# Patient Record
Sex: Male | Born: 1945
Health system: Southern US, Community
[De-identification: ages and names within clinical notes are randomized; demographics above are authoritative.]

## PROBLEM LIST (undated history)

## (undated) DIAGNOSIS — R972 Elevated prostate specific antigen [PSA]: Secondary | ICD-10-CM

## (undated) DIAGNOSIS — J189 Pneumonia, unspecified organism: Secondary | ICD-10-CM

## (undated) DIAGNOSIS — I350 Nonrheumatic aortic (valve) stenosis: Secondary | ICD-10-CM

## (undated) DIAGNOSIS — G4730 Sleep apnea, unspecified: Secondary | ICD-10-CM

## (undated) DIAGNOSIS — I4891 Unspecified atrial fibrillation: Secondary | ICD-10-CM

## (undated) DIAGNOSIS — I2510 Atherosclerotic heart disease of native coronary artery without angina pectoris: Secondary | ICD-10-CM

## (undated) DIAGNOSIS — R011 Cardiac murmur, unspecified: Secondary | ICD-10-CM

## (undated) DIAGNOSIS — I219 Acute myocardial infarction, unspecified: Secondary | ICD-10-CM

## (undated) DIAGNOSIS — Z952 Presence of prosthetic heart valve: Secondary | ICD-10-CM

## (undated) DIAGNOSIS — I10 Essential (primary) hypertension: Secondary | ICD-10-CM

## (undated) DIAGNOSIS — E785 Hyperlipidemia, unspecified: Secondary | ICD-10-CM

## (undated) DIAGNOSIS — K219 Gastro-esophageal reflux disease without esophagitis: Secondary | ICD-10-CM

## (undated) DIAGNOSIS — I499 Cardiac arrhythmia, unspecified: Secondary | ICD-10-CM

## (undated) HISTORY — DX: Nonrheumatic aortic (valve) stenosis: I35.0

## (undated) HISTORY — PX: COLON SURGERY: SHX602

## (undated) HISTORY — DX: Acute myocardial infarction, unspecified: I21.9

## (undated) HISTORY — PX: AORTIC VALVE SURGERY: SHX549

## (undated) HISTORY — PX: PROSTATE BIOPSY: SHX241

## (undated) HISTORY — DX: Essential (primary) hypertension: I10

## (undated) HISTORY — PX: OTHER SURGICAL HISTORY: SHX169

## (undated) HISTORY — DX: Sleep apnea, unspecified: G47.30

## (undated) HISTORY — DX: Unspecified atrial fibrillation: I48.91

## (undated) HISTORY — PX: SPHINCTEROTOMY: SHX5279

## (undated) HISTORY — PX: CHOLECYSTECTOMY: SHX55

## (undated) HISTORY — PX: ROTATOR CUFF REPAIR: SHX139

## (undated) HISTORY — PX: COLONOSCOPY W/ BIOPSIES AND POLYPECTOMY: SHX1376

## (undated) HISTORY — DX: Gastro-esophageal reflux disease without esophagitis: K21.9

## (undated) HISTORY — PX: CARDIAC VALVE REPLACEMENT: SHX585

## (undated) HISTORY — DX: Hyperlipidemia, unspecified: E78.5

## (undated) HISTORY — DX: Cardiac murmur, unspecified: R01.1

---

## 2005-02-22 ENCOUNTER — Ambulatory Visit: Payer: Self-pay | Admitting: Internal Medicine

## 2005-03-01 ENCOUNTER — Ambulatory Visit: Payer: Self-pay | Admitting: Internal Medicine

## 2005-03-21 ENCOUNTER — Ambulatory Visit: Payer: Self-pay | Admitting: Surgery

## 2009-06-28 ENCOUNTER — Ambulatory Visit: Payer: Self-pay | Admitting: Internal Medicine

## 2011-06-15 ENCOUNTER — Inpatient Hospital Stay: Payer: Self-pay | Admitting: Internal Medicine

## 2011-07-02 ENCOUNTER — Ambulatory Visit: Payer: Self-pay | Admitting: Internal Medicine

## 2012-12-08 DIAGNOSIS — K219 Gastro-esophageal reflux disease without esophagitis: Secondary | ICD-10-CM | POA: Insufficient documentation

## 2012-12-08 DIAGNOSIS — I350 Nonrheumatic aortic (valve) stenosis: Secondary | ICD-10-CM | POA: Insufficient documentation

## 2012-12-08 DIAGNOSIS — I4891 Unspecified atrial fibrillation: Secondary | ICD-10-CM | POA: Insufficient documentation

## 2012-12-08 DIAGNOSIS — I10 Essential (primary) hypertension: Secondary | ICD-10-CM | POA: Insufficient documentation

## 2012-12-09 ENCOUNTER — Encounter: Payer: Self-pay | Admitting: Surgery

## 2012-12-09 ENCOUNTER — Institutional Professional Consult (permissible substitution) (INDEPENDENT_AMBULATORY_CARE_PROVIDER_SITE_OTHER): Payer: BC Managed Care – PPO | Admitting: Surgery

## 2012-12-09 VITALS — BP 141/94 | HR 90 | Resp 16 | Ht 74.0 in | Wt 265.0 lb

## 2012-12-09 DIAGNOSIS — I359 Nonrheumatic aortic valve disorder, unspecified: Secondary | ICD-10-CM

## 2012-12-09 DIAGNOSIS — I350 Nonrheumatic aortic (valve) stenosis: Secondary | ICD-10-CM

## 2012-12-11 ENCOUNTER — Encounter: Payer: Self-pay | Admitting: Surgery

## 2012-12-11 NOTE — Progress Notes (Signed)
[address]            [place] [address]          [phone]      PCP is Fidela Juneau, MD Referring Provider : pt self-referred  Chief Complaint  Patient presents with  . Aortic Stenosis    wanting 2nd opinion regarding diagnosis    HPI:  The patient is a 67 year old with a history of aortic stenosis who has been followed by Dr. Gwen Pounds in [place]. His last echocardiogram on 05/07/2012 showed a peak gradient of 66 mm mercury and a mean gradient of 36 mm mercury with an aortic valve area of 0.85-1.02 cm square. There was normal left ventricular systolic function with an ejection fraction of 60%. Left ventricular size was normal. Review of his previous echocardiogram reports dating back to 07/12/2009 show a progressive increase in the gradient. The patient remains active and said that he has done well overall but notices that things are different now compared to one year ago. He now has some exertional dyspnea and fatigue that was not present one year ago. He does report some exertional chest tightness. These symptoms have not present prevented him from being active but he has deliberately decreased his exertion level to prevent these symptoms.  Past Medical History  Diagnosis Date  . Aortic stenosis   . Hypertension   . Hyperlipidemia   . GERD (gastroesophageal reflux disease)   . A-fib: He had one episode in the past converted with Cardizem.     now in normal sinus rhytm  . Sleep apnea: uses CPAP regularly and has noticed much improvement in how he feels.     Past Surgical History  Procedure Date  . Sphincterotomy   . Partial removal of colon   . Right shoulder surgery     separation/ dislocated  . Rotator cuff repair   . Prostate biopsy   . Colonoscopy w/ biopsies and polypectomy     3 polyps removed  . Achilles tendon rupture repair     History reviewed. No pertinent family history.  Social History History  Substance Use Topics    . Smoking status: Light Tobacco Smoker    Types: Cigars  . Smokeless tobacco: Never Used  . Alcohol Use: Yes     Comment: occasional     Current Outpatient Prescriptions  Medication Sig Dispense Refill  . aspirin 81 MG tablet Take 81 mg by mouth daily.      Marland Kitchen esomeprazole (NEXIUM) 40 MG capsule Take 40 mg by mouth daily before breakfast.      . lisinopril (PRINIVIL,ZESTRIL) 10 MG tablet Take 10 mg by mouth daily. Takes 15 mgm      . metoprolol succinate (TOPROL-XL) 25 MG 24 hr tablet Take 25 mg by mouth daily.      . rosuvastatin (CRESTOR) 5 MG tablet Take 5 mg by mouth daily.      . Tamsulosin HCl (FLOMAX) 0.4 MG CAPS Take 0.4 mg by mouth daily.        Allergies  Allergen Reactions  . Erythromycin     Review of Systems  Constitutional: Positive for activity change and fatigue. Negative for fever, chills, diaphoresis, appetite change and unexpected weight change.  HENT: Negative.   Eyes: Negative.   Respiratory: Positive for chest tightness and shortness of breath.        With heavy exertion  Cardiovascular: Negative for chest pain, palpitations and leg swelling.  Gastrointestinal: Negative.   Genitourinary: Negative.   Musculoskeletal: Negative.   Neurological: Negative.  Negative for dizziness, syncope and light-headedness.  Hematological: Negative.   Psychiatric/Behavioral: Negative.     BP 141/94  Pulse 90  Resp 16  Ht 6\' 2"  (1.88 m)  Wt 265 lb (120.203 kg)  BMI 34.02 kg/m2  SpO2 98% Physical Exam  Constitutional: He is oriented to person, place, and time. He appears well-developed and well-nourished. No distress.  HENT:  Head: Normocephalic and atraumatic.  Mouth/Throat: Oropharynx is clear and moist.  Eyes: EOM are normal. Pupils are equal, round, and reactive to light.  Neck: Normal range of motion. Neck supple. No JVD present. No thyromegaly present.  Cardiovascular: Normal rate and regular rhythm.        3/6 systolic murmur over aorta radiating into both  sides of the neck.  Pulmonary/Chest: Effort normal and breath sounds normal. No respiratory distress. He has no wheezes. He has no rales.  Abdominal: Soft. Bowel sounds are normal. He exhibits no distension and no mass. There is no tenderness.  Musculoskeletal: He exhibits no edema.  Lymphadenopathy:    He has no cervical adenopathy.  Neurological: He is alert and oriented to person, place, and time. He has normal strength. No cranial nerve deficit or sensory deficit.  Skin: Skin is warm and dry.  Psychiatric: He has a normal mood and affect.     Impression/Plan:  He has known aortic stenosis with gradient and valve area measurements by echocardiogram in June 2013 putting him just into the severe range. He is doing well overall but does have symptoms attributable to his aortic stenosis that he knows were not present one year ago. He does have moderate left ventricular hypertrophy. I recommended proceeding with aortic valve replacement to prevent further progression of symptoms and left ventricular hypertrophy. I reviewed all the echocardiogram findings with the patient and his wife and my reasons for recommending surgery. I answered all their questions. I told them that I didn't think he needed to have urgent surgery but should start to think about it so that he can plan to have it done what is most convenient in his busy schedule. He said that he is scheduled to see Dr. Gwen Pounds again soon for a followup stress test and will discuss it further with him. I think he should have a repeat 2-D echocardiogram to reevaluate the aortic stenosis and left ventricle. He would require cardiac catheterization prior to proceeding with aortic valve replacement. I told him I would be happy to see him back in the future to answer any further questions or discuss surgery further with them.

## 2013-01-05 ENCOUNTER — Institutional Professional Consult (permissible substitution) (INDEPENDENT_AMBULATORY_CARE_PROVIDER_SITE_OTHER): Payer: BC Managed Care – PPO | Admitting: Surgery

## 2013-01-05 ENCOUNTER — Encounter: Payer: Self-pay | Admitting: Surgery

## 2013-01-05 VITALS — BP 131/83 | HR 78 | Resp 20 | Ht 74.0 in | Wt 273.0 lb

## 2013-01-05 DIAGNOSIS — I350 Nonrheumatic aortic (valve) stenosis: Secondary | ICD-10-CM

## 2013-01-05 DIAGNOSIS — I359 Nonrheumatic aortic valve disorder, unspecified: Secondary | ICD-10-CM

## 2013-01-06 ENCOUNTER — Encounter: Payer: BC Managed Care – PPO | Admitting: Surgery

## 2013-01-07 ENCOUNTER — Encounter: Payer: Self-pay | Admitting: Surgery

## 2013-01-07 NOTE — Progress Notes (Signed)
                    [address]            [place] [address]          [phone]      HPI:  The patient returned to the office today to further discuss aortic valve replacement. He underwent a stress echocardiogram on 12/17/2012 which showed an abnormal treadmill ECG with ST changes consistent with left ventricular hypertrophy. There was average exercise tolerance for his age although it was less than on his previous test. There were normal stress echocardiographic images without evidence of ischemia. Since I last saw him a few weeks ago he has had no changes. He continues to have dyspnea with significant exertion such as going up 2 flights of stairs. He continues to notice that he does not have the same exertional tolerance he did a year ago.  Current Outpatient Prescriptions  Medication Sig Dispense Refill  . aspirin 81 MG tablet Take 81 mg by mouth daily.      Marland Kitchen esomeprazole (NEXIUM) 40 MG capsule Take 40 mg by mouth daily before breakfast.      . lisinopril (PRINIVIL,ZESTRIL) 10 MG tablet Take 10 mg by mouth daily. Takes 15 mgm      . metoprolol succinate (TOPROL-XL) 25 MG 24 hr tablet Take 25 mg by mouth daily.      . rosuvastatin (CRESTOR) 5 MG tablet Take 5 mg by mouth daily.      . Tamsulosin HCl (FLOMAX) 0.4 MG CAPS Take 0.4 mg by mouth daily.         Physical Exam: BP 131/83  Pulse 78  Resp 20  Ht 6\' 2"  (1.88 m)  Wt 273 lb (123.832 kg)  BMI 35.05 kg/m2  SpO2 98% Cardiac exam shows a 3/6 systolic murmur over the aorta radiating into both sides of the neck. Lung exam is clear. There is no peripheral edema.    Impression/Plan:  He has severe aortic stenosis with normal left ventricular function and moderate concentric left ventricular hypertrophy that is becoming symptomatic. I recommended that he make plans to undergo aortic valve replacement. He would like to wait until May or November and I told him that I thought it would be satisfactory to wait  until May but I think he would be taking some risk waiting until next November and there is a significant chance that he will have progression of his symptoms over that time. I discussed the operative procedure in detail with him. We discussed the pros and cons of mechanical and tissue valves. I think a tissue valve would probably be the best option for him given his age and lifestyle with frequent traveling. I think the risk of structural valve deterioration requiring further intervention over his lifetime would be low. Mechanical valves do have a 1-2% per year risk of both thromboembolism and anticoagulant related bleeding. He is in agreement with a tissue valve. He is planning on scheduling surgery for May and will contact our office in the near future to schedule that. He will require cardiac catheterization by Dr. Gwen Pounds preoperatively and I think that could be done anytime over the next couple months as long as he has about a week after the catheterization before proceeding with surgery to minimize any insult to the kidneys.

## 2013-01-20 ENCOUNTER — Other Ambulatory Visit: Payer: Self-pay | Admitting: *Deleted

## 2013-01-20 DIAGNOSIS — I359 Nonrheumatic aortic valve disorder, unspecified: Secondary | ICD-10-CM

## 2013-03-03 DIAGNOSIS — Z952 Presence of prosthetic heart valve: Secondary | ICD-10-CM

## 2013-03-03 HISTORY — DX: Presence of prosthetic heart valve: Z95.2

## 2013-03-13 HISTORY — PX: CARDIAC VALVE REPLACEMENT: SHX585

## 2013-03-25 ENCOUNTER — Ambulatory Visit: Payer: BC Managed Care – PPO | Admitting: Surgery

## 2013-03-25 ENCOUNTER — Other Ambulatory Visit (HOSPITAL_COMMUNITY): Payer: BC Managed Care – PPO

## 2013-03-27 ENCOUNTER — Inpatient Hospital Stay (HOSPITAL_COMMUNITY): Admission: RE | Admit: 2013-03-27 | Payer: BC Managed Care – PPO | Source: Ambulatory Visit | Admitting: Surgery

## 2013-03-27 ENCOUNTER — Encounter (HOSPITAL_COMMUNITY): Admission: RE | Payer: Self-pay | Source: Ambulatory Visit

## 2013-03-27 SURGERY — REPLACEMENT, AORTIC VALVE, OPEN
Anesthesia: General | Site: Chest

## 2013-04-09 ENCOUNTER — Ambulatory Visit (HOSPITAL_COMMUNITY): Payer: BC Managed Care – PPO

## 2013-04-09 ENCOUNTER — Encounter (HOSPITAL_COMMUNITY): Payer: BC Managed Care – PPO

## 2013-04-09 ENCOUNTER — Other Ambulatory Visit (HOSPITAL_COMMUNITY): Payer: BC Managed Care – PPO

## 2013-04-24 ENCOUNTER — Encounter (HOSPITAL_COMMUNITY): Payer: BC Managed Care – PPO

## 2014-02-05 HISTORY — PX: CORONARY ANGIOPLASTY WITH STENT PLACEMENT: SHX49

## 2014-03-30 DIAGNOSIS — I2510 Atherosclerotic heart disease of native coronary artery without angina pectoris: Secondary | ICD-10-CM | POA: Insufficient documentation

## 2014-11-10 DIAGNOSIS — G4733 Obstructive sleep apnea (adult) (pediatric): Secondary | ICD-10-CM | POA: Insufficient documentation

## 2015-12-07 ENCOUNTER — Other Ambulatory Visit: Payer: Self-pay | Admitting: Internal Medicine

## 2015-12-07 DIAGNOSIS — R1084 Generalized abdominal pain: Secondary | ICD-10-CM

## 2015-12-12 ENCOUNTER — Ambulatory Visit
Admission: RE | Admit: 2015-12-12 | Discharge: 2015-12-12 | Disposition: A | Discharge status: Home / Self Care | Payer: BLUE CROSS/BLUE SHIELD | Source: Ambulatory Visit | Attending: Internal Medicine | Admitting: Internal Medicine

## 2015-12-12 DIAGNOSIS — K4020 Bilateral inguinal hernia, without obstruction or gangrene, not specified as recurrent: Secondary | ICD-10-CM | POA: Insufficient documentation

## 2015-12-12 DIAGNOSIS — K5730 Diverticulosis of large intestine without perforation or abscess without bleeding: Secondary | ICD-10-CM | POA: Diagnosis not present

## 2015-12-12 DIAGNOSIS — R1084 Generalized abdominal pain: Secondary | ICD-10-CM | POA: Diagnosis present

## 2015-12-12 HISTORY — DX: Presence of prosthetic heart valve: Z95.2

## 2015-12-12 MED ORDER — IOHEXOL 350 MG/ML SOLN
100.0000 mL | Freq: Once | INTRAVENOUS | Status: AC | PRN
  Administered 2015-12-12: 100 mL via INTRAVENOUS

## 2016-04-08 ENCOUNTER — Telehealth: Payer: Self-pay | Admitting: Urology

## 2016-04-08 DIAGNOSIS — N5089 Other specified disorders of the male genital organs: Secondary | ICD-10-CM

## 2016-04-08 NOTE — Telephone Encounter (Signed)
Referred for possible testicular tumor, mild discomfort and palpable intratesticular mass.    Recommend stat scrotal ultrasound and f/u in my office Tuesday at 3:30 pm to reviewed results.    [name], MD

## 2016-04-09 ENCOUNTER — Ambulatory Visit
Admission: RE | Admit: 2016-04-09 | Discharge: 2016-04-09 | Disposition: A | Discharge status: Home / Self Care | Payer: BLUE CROSS/BLUE SHIELD | Source: Ambulatory Visit | Attending: Urology | Admitting: Urology

## 2016-04-09 DIAGNOSIS — N509 Disorder of male genital organs, unspecified: Secondary | ICD-10-CM | POA: Insufficient documentation

## 2016-04-09 DIAGNOSIS — N433 Hydrocele, unspecified: Secondary | ICD-10-CM | POA: Diagnosis not present

## 2016-04-09 DIAGNOSIS — N5089 Other specified disorders of the male genital organs: Secondary | ICD-10-CM

## 2016-04-09 NOTE — Telephone Encounter (Signed)
done

## 2016-04-09 NOTE — Telephone Encounter (Signed)
You are not in the office on Tuesday, where do you want me to put him?   Sharyn Lull

## 2016-04-10 ENCOUNTER — Encounter: Payer: Self-pay | Admitting: Urology

## 2016-04-10 ENCOUNTER — Ambulatory Visit (INDEPENDENT_AMBULATORY_CARE_PROVIDER_SITE_OTHER): Payer: BLUE CROSS/BLUE SHIELD | Admitting: Urology

## 2016-04-10 VITALS — BP 155/83 | HR 79 | Ht 74.0 in | Wt 277.0 lb

## 2016-04-10 DIAGNOSIS — R972 Elevated prostate specific antigen [PSA]: Secondary | ICD-10-CM | POA: Insufficient documentation

## 2016-04-10 DIAGNOSIS — E785 Hyperlipidemia, unspecified: Secondary | ICD-10-CM | POA: Insufficient documentation

## 2016-04-10 DIAGNOSIS — Z87898 Personal history of other specified conditions: Secondary | ICD-10-CM

## 2016-04-10 DIAGNOSIS — N509 Disorder of male genital organs, unspecified: Secondary | ICD-10-CM

## 2016-04-10 DIAGNOSIS — I4891 Unspecified atrial fibrillation: Secondary | ICD-10-CM | POA: Insufficient documentation

## 2016-04-10 DIAGNOSIS — N5089 Other specified disorders of the male genital organs: Secondary | ICD-10-CM

## 2016-04-10 DIAGNOSIS — N400 Enlarged prostate without lower urinary tract symptoms: Secondary | ICD-10-CM | POA: Diagnosis not present

## 2016-04-10 MED ORDER — SULFAMETHOXAZOLE-TRIMETHOPRIM 800-160 MG PO TABS: 1.0000 | ORAL_TABLET | Freq: Two times a day (BID) | ORAL | Status: DC

## 2016-04-10 NOTE — Progress Notes (Signed)
04/10/2016 4:16 PM   [place] 1946/09/07 IN:[id number]  Referring provider: Corey Skains, MD [facility] [place], [place] [address]  Chief Complaint  Patient presents with  . Testicle Pain    New Patient    HPI:  70 year old male who presents today for further evaluation of right scrotal mass. He first noticed this area of mild tenderness within the right scrotum approximately 3 weeks ago.  At first, it started with a dull ache which led him to do self-examination at which time a right-sided scrotal mass was identified. This is remained relatively stable in size. No redness or drainage. No history of scrotal abscesses. The area is not particularly painful but is uncomfortable at times especially with manipulation or mild daily trauma.    He denies any urinary symptoms including dysuria, hematuria, urinary urgency or frequency.  He is having history of BPH on Flomax. He was seen by urologist proximal May 5 years ago, Dr. cope at [facility] for an episode of elevated PSA which rose from 0.62  to 3.8 leading to a negative prostate biopsy.  His most recent PSA was 1.4 on 04/03/2016.  He does also have a remote history of colovesical fistula status post colectomy with bladder closure in 1991.  He is status post vasectomy in 1984.  No weight loss, fevers, or chills.  He will be leaving late next week for a 2 week fishing trip to [place].  PMH: Past Medical History  Diagnosis Date  . Aortic stenosis   . Hypertension   . Hyperlipidemia   . GERD (gastroesophageal reflux disease)   . A-fib ([facility])     now in normal sinus rhytm  . Sleep apnea   . Aortic valve replaced 03/2013  . Heart attack ([place])   . Heart murmur     Surgical History: Past Surgical History  Procedure Laterality Date  . Sphincterotomy    . Partial removal of colon    . Right shoulder surgery      separation/ dislocated  . Rotator cuff repair    . Prostate biopsy      . Colonoscopy w/ biopsies and polypectomy      3 polyps removed  . Achilles tendon rupture repair      Home Medications:    Medication List       This list is accurate as of: 04/10/16  4:16 PM.  Always use your most recent med list.               aspirin 81 MG tablet  Take 81 mg by mouth daily.     atorvastatin 80 MG tablet  Commonly known as:  LIPITOR  Take by mouth.     esomeprazole 40 MG capsule  Commonly known as:  NEXIUM  Take 40 mg by mouth daily before breakfast.     lisinopril 10 MG tablet  Commonly known as:  PRINIVIL,ZESTRIL  Take 10 mg by mouth daily. Takes 15 mgm     tamsulosin 0.4 MG Caps capsule  Commonly known as:  FLOMAX  Take 0.4 mg by mouth daily.        Allergies: No Known Allergies  Family History: Family History  Problem Relation Age of Onset  . Bladder Cancer Neg Hx   . Prostate cancer Neg Hx   . Kidney cancer Neg Hx     Social History:  reports that he has quit smoking. His smoking use included Cigars. He has never  used smokeless tobacco. He reports that he drinks alcohol. He reports that he does not use illicit drugs.  ROS: UROLOGY Frequent Urination?: No Hard to postpone urination?: No Burning/pain with urination?: No Get up at night to urinate?: Yes Leakage of urine?: Yes Urine stream starts and stops?: No Trouble starting stream?: No Do you have to strain to urinate?: No Blood in urine?: No Urinary tract infection?: No Sexually transmitted disease?: No Injury to kidneys or bladder?: No Painful intercourse?: No Weak stream?: No Erection problems?: No Penile pain?: No  Gastrointestinal Nausea?: No Vomiting?: No Indigestion/heartburn?: No Diarrhea?: No Constipation?: No  Constitutional Fever: No Night sweats?: No Weight loss?: No Fatigue?: No  Skin Skin rash/lesions?: No Itching?: No  Eyes Blurred vision?: No Double vision?: No  Ears/Nose/Throat Sore throat?: No Sinus problems?:  No  Hematologic/Lymphatic Swollen glands?: No Easy bruising?: No  Cardiovascular Leg swelling?: No Chest pain?: No  Respiratory Cough?: No Shortness of breath?: No  Endocrine Excessive thirst?: No  Musculoskeletal Back pain?: No Joint pain?: No  Neurological Headaches?: No Dizziness?: No  Psychologic Depression?: No Anxiety?: No  Physical Exam: BP 155/83 mmHg  Pulse 79  Ht 6\' 2"  (1.88 m)  Wt 277 lb (125.646 kg)  BMI 35.55 kg/m2  Constitutional:  Alert and oriented, No acute distress. HEENT: [place] AT, moist mucus membranes.  Trachea midline, no masses. Cardiovascular: No clubbing, cyanosis, or edema. Respiratory: Normal respiratory effort, no increased work of breathing. GI: Abdomen is soft, nontender, nondistended, no abdominal masses GU: No CVA tenderness. Circumcised phallus with orthotopic meatus. Scrotum unremarkable without any erythema, edema, or induration. Left testicle normal without masses. Right testicle well-circumscribed, normal without any intratesticular masses. Firm, complex, irregular contour mildly tender mass located in what appears to be the tail of the right epididymis, ~2.5 cm without overlying skin changes.  This mass appears to be discretely separate from the right testicle. Skin: No rashes, bruises or suspicious lesions. Lymph: No cervical or inguinal adenopathy. Neurologic: Grossly intact, no focal deficits, moving all 4 extremities. Psychiatric: Normal mood and affect.  Laboratory Data: Labs from 04/03/2016 reviewed Creatinine 1.3 WBC 4.9 Hemoglobin 15.1 Platelets 129 Hbg A1c 6.1   Urinalysis UA from 04/03/2016 negative without evidence of blood, WBC, or nitrites. Urine culture growing less than 10,000 colonies, not speciated.  Pertinent Imaging:  Study Result     CLINICAL DATA: 70 year old with right scrotal mass for 1 month. Slight soreness. No known injury.  EXAM: SCROTAL ULTRASOUND  DOPPLER ULTRASOUND OF THE  TESTICLES  TECHNIQUE: Complete ultrasound examination of the testicles, epididymis, and other scrotal structures was performed. Color and spectral Doppler ultrasound were also utilized to evaluate blood flow to the testicles.  COMPARISON: Pelvic CT 12/12/2015.  FINDINGS: Right testicle  Measurements: 4.5 x 2.5 x 3.5 cm. No testicular mass demonstrated. There is normal blood flow with color Doppler.  Left testicle  Measurements: 4.7 x 2.6 x 3.7 cm. No mass or microlithiasis visualized.  Right epididymis: Normal in size and appearance. There is an extra testicular mass which appears separate from the epididymis, measuring 1.7 x 2.3 x 2.6 cm. This is complex with solid and cystic areas as well as internal blood flow. No peristalsis identified.  Left epididymis: 2.5 mm epididymal cyst. Otherwise unremarkable.  Hydrocele: Small bilateral hydroceles.  Varicocele: None visualized.  Pulsed Doppler interrogation of both testes demonstrates normal low resistance arterial and venous waveforms bilaterally.  IMPRESSION: 1. Both testes appear normal. No evidence of intratesticular mass or torsion. 2. There is  an extratesticular mass on the right which appears separate from the epididymal head. Differential considerations include focal inflammation or atypical neoplasm involving the tunica vaginalis or epididymal tail. A hernia is consider less likely. Clinical follow up recommended with consideration of follow-up ultrasound or MRI for further characterization. 3. Small bilateral hydroceles.   Electronically Signed  By: Richardean Sale M.D.  On: 04/09/2016 12:23        Assessment & Plan:    1. Epididymal mass Examination today consistent with mildly tender complex lesion involving the tail of the right epididymis. Inflammatory/infectious process highly favored over malignancy. We discussed today that extratesticular testicular tumors are very uncommon  and given that this area arose relatively quickly and is mildly tender, the former is significantly more likely.  We discussed various options today including continued observation with addition of NSAIDs, supportive care, and antibiotics 2 weeks with reevaluation.  Alternatively, excisional biopsy was also discussed. We discussed potential risks/ benefits.  He is comfortable with observation/NSAIDs/supportive care/antibiotics. If failure to improve at that point, maybe consider proceeding with excision versus repeat ultrasound.  - sulfamethoxazole-trimethoprim (BACTRIM DS,SEPTRA DS) 800-160 MG tablet; Take 1 tablet by mouth 2 (two) times daily.  Dispense: 28 tablet; Refill: 0  2. History of elevated PSA Stable, non elevated S/p negative biopsy  3. BPH  Continue flomax   Follow up in 2-3 weeks for reevaluation  [name], MD  [facility] [address] [place], [place] [address] [phone]

## 2016-04-18 ENCOUNTER — Other Ambulatory Visit: Payer: Self-pay

## 2016-04-19 DIAGNOSIS — Z9889 Other specified postprocedural states: Secondary | ICD-10-CM | POA: Insufficient documentation

## 2016-05-07 ENCOUNTER — Ambulatory Visit (INDEPENDENT_AMBULATORY_CARE_PROVIDER_SITE_OTHER): Payer: BLUE CROSS/BLUE SHIELD | Admitting: Urology

## 2016-05-07 ENCOUNTER — Encounter: Payer: Self-pay | Admitting: Urology

## 2016-05-07 VITALS — BP 148/91 | HR 80 | Ht 74.0 in | Wt 270.0 lb

## 2016-05-07 DIAGNOSIS — Z87898 Personal history of other specified conditions: Secondary | ICD-10-CM | POA: Diagnosis not present

## 2016-05-07 DIAGNOSIS — N509 Disorder of male genital organs, unspecified: Secondary | ICD-10-CM | POA: Diagnosis not present

## 2016-05-07 DIAGNOSIS — N400 Enlarged prostate without lower urinary tract symptoms: Secondary | ICD-10-CM

## 2016-05-07 DIAGNOSIS — N5089 Other specified disorders of the male genital organs: Secondary | ICD-10-CM

## 2016-05-07 NOTE — Progress Notes (Signed)
8:08 PM  05/07/2016   Adam Zavala 08/24/1946 [id number]  Referring provider: Corey Skains, MD [place] [place], [place] [address]  Chief Complaint  Patient presents with  . Epididymal Mass    20month    HPI:  70 year old Male who presents for follow-up for right extratesticular scrotal mass.   Right extratesticular scrotal mass Initially seen and evaluated approximately one month ago for right extratesticular scrotal mass. At that time, he is having right testicular discomfort.  Scrotal ultrasound showed a 1.7 x 2.2 x 2.6 solid and cystic lesion adjacent to the epididymis of unclear etiology.  He was treated with extended course of Bactrim while away on vacation in [place]. He reports that the tenderness has completely resolved. He thinks that the overall size of the lesion is likely stable. No overlying skin changes, erythema, or drainage. He has no complaints today.  History of BPH/ history of elevated PSA He is having history of BPH on Flomax. He was seen by urologist proximal May 5 years ago, Dr. cope at [facility] urology for an episode of elevated PSA which rose from 0.62  to 3.8 leading to a negative prostate biopsy.  His most recent PSA was 1.4 on 04/03/2016.  He denies any urinary symptoms including dysuria, hematuria, urinary urgency or frequency.   PMH: Past Medical History  Diagnosis Date  . Aortic stenosis   . Hypertension   . Hyperlipidemia   . GERD (gastroesophageal reflux disease)   . A-fib (HCC)     now in normal sinus rhytm  . Sleep apnea   . Aortic valve replaced 03/2013  . Heart attack ([place])   . Heart murmur     Surgical History: Past Surgical History  Procedure Laterality Date  . Sphincterotomy    . Partial removal of colon    . Right shoulder surgery      separation/ dislocated  . Rotator cuff repair    . Prostate biopsy    . Colonoscopy w/ biopsies and polypectomy      3 polyps removed  . Achilles  tendon rupture repair      Home Medications:    Medication List       This list is accurate as of: 05/07/16  8:08 PM.  Always use your most recent med list.               aspirin 81 MG tablet  Take 81 mg by mouth daily.     atorvastatin 80 MG tablet  Commonly known as:  LIPITOR  Take by mouth.     esomeprazole 40 MG capsule  Commonly known as:  NEXIUM  Take 40 mg by mouth daily before breakfast.     lisinopril 10 MG tablet  Commonly known as:  PRINIVIL,ZESTRIL  Take 10 mg by mouth daily. Takes 15 mgm     tamsulosin 0.4 MG Caps capsule  Commonly known as:  FLOMAX  Take 0.4 mg by mouth daily.        Allergies: No Known Allergies  Family History: Family History  Problem Relation Age of Onset  . Bladder Cancer Neg Hx   . Prostate cancer Neg Hx   . Kidney cancer Neg Hx     Social History:  reports that he has quit smoking. His smoking use included Cigars. He has never used smokeless tobacco. He reports that he drinks alcohol. He reports that he does not use illicit drugs.  ROS: UROLOGY Frequent Urination?:  No Hard to postpone urination?: No Burning/pain with urination?: No Get up at night to urinate?: No Leakage of urine?: No Urine stream starts and stops?: No Trouble starting stream?: No Do you have to strain to urinate?: No Blood in urine?: No Urinary tract infection?: No Sexually transmitted disease?: No Injury to kidneys or bladder?: No Painful intercourse?: No Weak stream?: No Erection problems?: No Penile pain?: No  Gastrointestinal Nausea?: No Vomiting?: No Indigestion/heartburn?: No Diarrhea?: No Constipation?: No  Constitutional Fever: No Night sweats?: No Weight loss?: No Fatigue?: No  Skin Skin rash/lesions?: No Itching?: No  Eyes Blurred vision?: No Double vision?: No  Ears/Nose/Throat Sore throat?: No Sinus problems?: No  Hematologic/Lymphatic Swollen glands?: No Easy bruising?: No  Cardiovascular Leg swelling?:  No Chest pain?: No  Respiratory Cough?: No Shortness of breath?: No  Endocrine Excessive thirst?: No  Musculoskeletal Back pain?: No Joint pain?: No  Neurological Headaches?: No Dizziness?: No  Psychologic Depression?: No Anxiety?: No  Physical Exam: BP 148/91 mmHg  Pulse 80  Ht 6\' 2"  (1.88 m)  Wt 270 lb (122.471 kg)  BMI 34.65 kg/m2  Constitutional:  Alert and oriented, No acute distress. HEENT: [place] AT, moist mucus membranes.  Trachea midline, no masses. Cardiovascular: No clubbing, cyanosis, or edema. Respiratory: Normal respiratory effort, no increased work of breathing. GI: Abdomen is soft, nontender, nondistended, no abdominal masses GU: No CVA tenderness. Circumcised phallus with orthotopic meatus. Scrotum unremarkable without any erythema, edema, or induration. Left testicle normal without masses. Right testicle well-circumscribed, normal without any intratesticular masses. Firm, approximately 1 cm spherical right extratesticular mass which appears to be contiguous with the tail of the right epididymis. The firmness extends posteriorly in a linear fashion approximately 1 cm it has a rugated texture along the inferior body of the epidiymis.  Non tender.  No overlying skin changes.  This mass appears to be discretely separate from the right testicle. Skin: No rashes, bruises or suspicious lesions. Lymph: No cervical or inguinal adenopathy. Neurologic: Grossly intact, no focal deficits, moving all 4 extremities. Psychiatric: Normal mood and affect.  Laboratory Data: Labs from 04/03/2016 reviewed Creatinine 1.3 WBC 4.9 Hemoglobin 15.1 Platelets 129 Hbg A1c 6.1   Urinalysis UA from 04/03/2016 negative without evidence of blood, WBC, or nitrites. Urine culture growing less than 10,000 colonies, not speciated.  Pertinent Imaging:  Study Result     CLINICAL DATA: 70 year old with right scrotal mass for 1 month. Slight soreness. No known injury.  EXAM: SCROTAL  ULTRASOUND  DOPPLER ULTRASOUND OF THE TESTICLES  TECHNIQUE: Complete ultrasound examination of the testicles, epididymis, and other scrotal structures was performed. Color and spectral Doppler ultrasound were also utilized to evaluate blood flow to the testicles.  COMPARISON: Pelvic CT 12/12/2015.  FINDINGS: Right testicle  Measurements: 4.5 x 2.5 x 3.5 cm. No testicular mass demonstrated. There is normal blood flow with color Doppler.  Left testicle  Measurements: 4.7 x 2.6 x 3.7 cm. No mass or microlithiasis visualized.  Right epididymis: Normal in size and appearance. There is an extra testicular mass which appears separate from the epididymis, measuring 1.7 x 2.3 x 2.6 cm. This is complex with solid and cystic areas as well as internal blood flow. No peristalsis identified.  Left epididymis: 2.5 mm epididymal cyst. Otherwise unremarkable.  Hydrocele: Small bilateral hydroceles.  Varicocele: None visualized.  Pulsed Doppler interrogation of both testes demonstrates normal low resistance arterial and venous waveforms bilaterally.  IMPRESSION: 1. Both testes appear normal. No evidence of intratesticular mass or torsion.  2. There is an extratesticular mass on the right which appears separate from the epididymal head. Differential considerations include focal inflammation or atypical neoplasm involving the tunica vaginalis or epididymal tail. A hernia is consider less likely. Clinical follow up recommended with consideration of follow-up ultrasound or MRI for further characterization. 3. Small bilateral hydroceles.   Electronically Signed  By: Richardean Sale M.D.  On: 04/09/2016 12:23        Assessment & Plan:    1. Epididymal mass On exam today, the mass appears to be slightly smaller, more well defined, and non nontender and likely contiguous with the right tail and body of the epididymis.  Based on examination and resolution of  tenderness, I feel that this is an inflammatory/infectious process which appears to be slowly resolving.  Again, we reviewed options including surgical excision versus continued observation. Although we're unable to completely rule out malignancy, this is very unlikely both given the overall extremely low incidence of extra testicular malignancies as well as its improvement on examination today.  We did review the risks and benefits of both.  Plan to continue to follow this lesion. Plan for return scrotal check in 4 weeks unless he notes any change, progression, or recurrence and tenderness.   2. History of elevated PSA Stable, non elevated S/p negative biopsy  3. BPH  Continue flomax  Return in about 4 weeks (around 06/04/2016) for scrotal check.    [name], MD  [facility] [address] [place], [place] [address] [phone]

## 2016-05-08 ENCOUNTER — Ambulatory Visit: Payer: BLUE CROSS/BLUE SHIELD | Admitting: Urology

## 2016-06-13 ENCOUNTER — Ambulatory Visit (INDEPENDENT_AMBULATORY_CARE_PROVIDER_SITE_OTHER): Payer: BLUE CROSS/BLUE SHIELD | Admitting: Urology

## 2016-06-13 ENCOUNTER — Encounter: Payer: Self-pay | Admitting: Urology

## 2016-06-13 VITALS — BP 151/82 | HR 69 | Ht 73.0 in | Wt 267.0 lb

## 2016-06-13 DIAGNOSIS — N509 Disorder of male genital organs, unspecified: Secondary | ICD-10-CM

## 2016-06-13 DIAGNOSIS — N5089 Other specified disorders of the male genital organs: Secondary | ICD-10-CM

## 2016-06-13 DIAGNOSIS — Z87898 Personal history of other specified conditions: Secondary | ICD-10-CM | POA: Diagnosis not present

## 2016-06-13 DIAGNOSIS — N400 Enlarged prostate without lower urinary tract symptoms: Secondary | ICD-10-CM | POA: Diagnosis not present

## 2016-06-13 NOTE — Progress Notes (Signed)
12:58 PM  06/13/2016   Adam Zavala [birth year] [id number]  Referring provider: Corey Skains, MD [place], [place] [address]  Chief Complaint  Patient presents with  . Epididymal Mass    1 month    HPI:  70 year old Male who presents for follow-up for right extratesticular scrotal mass.   Right extratesticular scrotal mass Initially seen and evaluated approximately one month ago for right extratesticular scrotal mass. At that time, he is having right testicular discomfort.  Scrotal ultrasound showed a 1.7 x 2.2 x 2.6 solid and cystic lesion adjacent to the epididymis of unclear etiology.  He was treated with extended course of Bactrim while away on vacation in [place]. He reports that the tenderness has completely resolved.   Last visit ~4 weeks ago shows slightly decreased size of lesion which appeared to be contiguous with the tail of the right epididymis, perhaps slightly smaller than initial presentation.  No overlying skin changes, erythema, or drainage. He has no complaints today.  No pain. He thinks that the lesion is unchanged from 1 month ago.  Of note, he does do a lot of traveling including to foreign countries. No personal history of tuberculosis.  He does have a history of a biologic aortic valve, and ASA 81 mg. Followed by Dr. Drema Dallas regularly.  History of BPH/ history of elevated PSA He is having history of BPH on Flomax. He was seen by urologist proximal May 5 years ago, Dr. cope at [facility] urology for an episode of elevated PSA which rose from 0.62  to 3.8 leading to a negative prostate biopsy.  His most recent PSA was 1.4 on 04/03/2016.  He denies any urinary symptoms including dysuria, hematuria, urinary urgency or frequency.   PMH: Past Medical History  Diagnosis Date  . Aortic stenosis   . Hypertension   . Hyperlipidemia   . GERD (gastroesophageal reflux disease)   . A-fib (HCC)     now in normal sinus rhytm  . Sleep apnea   .  Aortic valve replaced 03/2013  . Heart attack ([place])   . Heart murmur     Surgical History: Past Surgical History  Procedure Laterality Date  . Sphincterotomy    . Partial removal of colon    . Right shoulder surgery      separation/ dislocated  . Rotator cuff repair    . Prostate biopsy    . Colonoscopy w/ biopsies and polypectomy      3 polyps removed  . Achilles tendon rupture repair      Home Medications:    Medication List       This list is accurate as of: 06/13/16 12:58 PM.  Always use your most recent med list.               aspirin 81 MG tablet  Take 81 mg by mouth daily.     atorvastatin 80 MG tablet  Commonly known as:  LIPITOR  Take by mouth.     esomeprazole 40 MG capsule  Commonly known as:  NEXIUM  Take 40 mg by mouth daily before breakfast.     lisinopril 10 MG tablet  Commonly known as:  PRINIVIL,ZESTRIL  Take 10 mg by mouth daily. Takes 15 mgm     tamsulosin 0.4 MG Caps capsule  Commonly known as:  FLOMAX  Take 0.4 mg by mouth daily.        Allergies: No Known Allergies  Family History: Family History  Problem Relation Age of Onset  . Bladder Cancer Neg Hx   . Prostate cancer Neg Hx   . Kidney cancer Neg Hx     Social History:  reports that he has quit smoking. His smoking use included Cigars. He has never used smokeless tobacco. He reports that he drinks alcohol. He reports that he does not use illicit drugs.  ROS: UROLOGY Frequent Urination?: No Hard to postpone urination?: No Burning/pain with urination?: No Get up at night to urinate?: No Leakage of urine?: No Urine stream starts and stops?: No Trouble starting stream?: No Do you have to strain to urinate?: No Blood in urine?: No Urinary tract infection?: No Sexually transmitted disease?: No Injury to kidneys or bladder?: No Painful intercourse?: No Weak stream?: No Erection problems?: No Penile pain?: No  Gastrointestinal Nausea?: No Vomiting?:  No Indigestion/heartburn?: No Diarrhea?: No Constipation?: No  Constitutional Fever: No Night sweats?: No Weight loss?: No Fatigue?: No  Skin Skin rash/lesions?: No Itching?: No  Eyes Blurred vision?: No Double vision?: No  Ears/Nose/Throat Sore throat?: No Sinus problems?: No  Hematologic/Lymphatic Swollen glands?: No Easy bruising?: No  Cardiovascular Leg swelling?: No Chest pain?: No  Respiratory Cough?: No Shortness of breath?: No  Endocrine Excessive thirst?: No  Musculoskeletal Back pain?: No Joint pain?: No  Neurological Headaches?: No Dizziness?: No  Psychologic Depression?: No Anxiety?: No  Physical Exam: BP 151/82 mmHg  Pulse 69  Ht 6\' 1"  (1.854 m)  Wt 267 lb (121.11 kg)  BMI 35.23 kg/m2  Constitutional:  Alert and oriented, No acute distress. HEENT: [place] AT, moist mucus membranes.  Trachea midline, no masses. Cardiovascular: No clubbing, cyanosis, or edema. RRR Respiratory: Normal respiratory effort, no increased work of breathing. CTAB. GI: Abdomen is soft, nontender, nondistended, no abdominal masses GU: No CVA tenderness. Circumcised phallus with orthotopic meatus. Scrotum unremarkable without any erythema, edema, or induration. Left testicle normal without masses. Right testicle well-circumscribed, normal without any intratesticular masses. Firm, approximately 1 cm spherical right extratesticular mass which appears to be contiguous with the tail of the right epididymis. The firmness extends posteriorly in a linear fashion approximately 1 cm it has a rugated texture along the inferior body of the epidiymis.  Non tender.  No overlying skin changes.  This mass appears to be discretely separate from the right testicle.  This exam is essentally unchanged from 1 month ago. Skin: No rashes, bruises or suspicious lesions. Lymph: No cervical or inguinal adenopathy. Neurologic: Grossly intact, no focal deficits, moving all 4  extremities. Psychiatric: Normal mood and affect.  Laboratory Data: Labs from 04/03/2016 reviewed Creatinine 1.3 WBC 4.9 Hemoglobin 15.1 Platelets 129 Hbg A1c 6.1   Urinalysis UA from 04/03/2016 negative without evidence of blood, WBC, or nitrites. Urine culture growing less than 10,000 colonies, not speciated.  Pertinent Imaging:  Study Result     CLINICAL DATA: 70 year old with right scrotal mass for 1 month. Slight soreness. No known injury.  EXAM: SCROTAL ULTRASOUND  DOPPLER ULTRASOUND OF THE TESTICLES  TECHNIQUE: Complete ultrasound examination of the testicles, epididymis, and other scrotal structures was performed. Color and spectral Doppler ultrasound were also utilized to evaluate blood flow to the testicles.  COMPARISON: Pelvic CT 12/12/2015.  FINDINGS: Right testicle  Measurements: 4.5 x 2.5 x 3.5 cm. No testicular mass demonstrated. There is normal blood flow with color Doppler.  Left testicle  Measurements: 4.7 x 2.6 x 3.7 cm. No mass or microlithiasis visualized.  Right epididymis: Normal in size and appearance. There is an  extra testicular mass which appears separate from the epididymis, measuring 1.7 x 2.3 x 2.6 cm. This is complex with solid and cystic areas as well as internal blood flow. No peristalsis identified.  Left epididymis: 2.5 mm epididymal cyst. Otherwise unremarkable.  Hydrocele: Small bilateral hydroceles.  Varicocele: None visualized.  Pulsed Doppler interrogation of both testes demonstrates normal low resistance arterial and venous waveforms bilaterally.  IMPRESSION: 1. Both testes appear normal. No evidence of intratesticular mass or torsion. 2. There is an extratesticular mass on the right which appears separate from the epididymal head. Differential considerations include focal inflammation or atypical neoplasm involving the tunica vaginalis or epididymal tail. A hernia is consider less  likely. Clinical follow up recommended with consideration of follow-up ultrasound or MRI for further characterization. 3. Small bilateral hydroceles.   Electronically Signed  By: Richardean Sale M.D.  On: 04/09/2016 12:23        Assessment & Plan:    1. Epididymal mass Stable right extratesticular mass which appears to be contiguous with the right epididymis. Again, I feel that this is highly unlikely to represent a malignant process especially given its stability. Suspect inflammatory or granulomatous process.  Again, we reviewed options including surgical excision versus continued observation. Although we're unable to completely rule out malignancy, this is very unlikely both given the overall extremely low incidence of extra testicular malignancies as well as its improvement on examination today.  We did review the risks and benefits of both.  At this point, he is interested in surgical excision of his right extratesticular mass/epididymis.  We've reviewed the surgical procedure in detail today including the postoperative course. We reviewed possible complications including chronic testicular pain, injury to the right testicle, right testicular compromise, hematoma, infection, discomfort, amongst others. He will need to hold aspirin 81 mg for 7 days prior to the procedure. All of his questions were answered.  2. History of elevated PSA Stable, non elevated S/p negative biopsy  3. BPH  Continue flomax  Will arrange for surgery in a few months after returns from extended vacation in [place].     [name], MD  [facility] [address], [place], [place] [address] [phone]

## 2016-08-14 ENCOUNTER — Telehealth: Payer: Self-pay | Admitting: Radiology

## 2016-08-14 NOTE — Telephone Encounter (Signed)
LMOM. F/u call per Dr Erlene Quan regarding extratesticular mass.

## 2016-09-21 ENCOUNTER — Other Ambulatory Visit: Payer: Self-pay | Admitting: Radiology

## 2016-09-24 ENCOUNTER — Other Ambulatory Visit: Payer: Self-pay | Admitting: Radiology

## 2016-09-24 DIAGNOSIS — N5089 Other specified disorders of the male genital organs: Secondary | ICD-10-CM

## 2016-09-25 NOTE — Telephone Encounter (Signed)
Spoke with pt's assistant, Kelly May, per pt's request. Notified Claiborne Billings of surgery scheduled on 11/12/16 with Dr Erlene Quan, pre-admit testing appt on 10/24/16 @9 :30 & to call Friday prior to surgery for arrival time to [facility]. Advised that pt should hold ASA 81mg  7 days prior to surgery. Kelly voices understanding & will pass along information to the pt. Same information was mailed to pt as well.

## 2016-10-24 ENCOUNTER — Encounter
Admission: RE | Admit: 2016-10-24 | Discharge: 2016-10-24 | Disposition: A | Discharge status: Home / Self Care | Payer: BLUE CROSS/BLUE SHIELD | Source: Ambulatory Visit | Attending: Urology | Admitting: Urology

## 2016-10-24 DIAGNOSIS — I10 Essential (primary) hypertension: Secondary | ICD-10-CM | POA: Insufficient documentation

## 2016-10-24 DIAGNOSIS — N5089 Other specified disorders of the male genital organs: Secondary | ICD-10-CM | POA: Insufficient documentation

## 2016-10-24 DIAGNOSIS — I4891 Unspecified atrial fibrillation: Secondary | ICD-10-CM | POA: Insufficient documentation

## 2016-10-24 DIAGNOSIS — Z01810 Encounter for preprocedural cardiovascular examination: Secondary | ICD-10-CM | POA: Insufficient documentation

## 2016-10-24 DIAGNOSIS — Z01812 Encounter for preprocedural laboratory examination: Secondary | ICD-10-CM | POA: Diagnosis not present

## 2016-10-24 LAB — HEMOGLOBIN: HEMOGLOBIN: 14.7 g/dL (ref 13.0–18.0)

## 2016-10-24 NOTE — Patient Instructions (Signed)
Your procedure is scheduled on: Monday 11/12/16 Report to [facility]. 2ND FLOOR MEDICAL MALL ENTRANCE. To find out your arrival time please call [phone] between 1PM - 3PM on Friday 11/09/16.  Remember: Instructions that are not followed completely may result in serious medical risk, up to and including death, or upon the discretion of your surgeon and anesthesiologist your surgery may need to be rescheduled.    __X__ 1. Do not eat food or drink liquids after midnight. No gum chewing or hard candies.     __X__ 2. No Alcohol for 24 hours before or after surgery.   ____ 3. Bring all medications with you on the day of surgery if instructed.    __X__ 4. Notify your doctor if there is any change in your medical condition     (cold, fever, infections).             __X___5. No smoking within 24 hours of your surgery.     Do not wear jewelry, make-up, hairpins, clips or nail polish.  Do not wear lotions, powders, or perfumes.   Do not shave 48 hours prior to surgery. Men may shave face and neck.  Do not bring valuables to the hospital.    [facility] is not responsible for any belongings or valuables.               Contacts, dentures or bridgework may not be worn into surgery.  Leave your suitcase in the car. After surgery it may be brought to your room.  For patients admitted to the hospital, discharge time is determined by your                treatment team.   Patients discharged the day of surgery will not be allowed to drive home.   Please read over the following fact sheets that you were given:   Pain Booklet   __X__ Take these medicines the morning of surgery with A SIP OF WATER:    1. LISINOPRIL  2. [place]  3.   4.  5.  6.  ____ Fleet Enema (as directed)   __X__ Use CHG Soap as directed  ____ Use inhalers on the day of surgery  ____ Stop metformin 2 days prior to surgery    ____ Take 1/2 of usual insulin dose the night before surgery and none on the morning of  surgery.   __X__ Stop Coumadin/Plavix/aspirin on CONTACT DR Erlene Quan REGARDING ABILITY TO STOP ASPIRIN  __X__ Stop Anti-inflammatories such as Advil, Aleve, Ibuprofen, Motrin, Naproxen, Naprosyn, Goodies,powder, or aspirin products.  OK to take Tylenol.   __X__ Stop supplements until after surgery. STOP FISH OIL 1 WEEK BEFORE SURGERY   __X__ Bring C-Pap to the hospital.

## 2016-11-11 MED ORDER — CEFAZOLIN SODIUM-DEXTROSE 2-4 GM/100ML-% IV SOLN
2.0000 g | INTRAVENOUS | Status: AC
  Administered 2016-11-12: 2 g via INTRAVENOUS

## 2016-11-12 ENCOUNTER — Ambulatory Visit: Payer: BLUE CROSS/BLUE SHIELD | Admitting: Anesthesiology

## 2016-11-12 ENCOUNTER — Encounter
Admission: RE | Disposition: A | Discharge status: Home / Self Care | Payer: Self-pay | Source: Ambulatory Visit | Attending: Urology

## 2016-11-12 ENCOUNTER — Encounter: Payer: Self-pay | Admitting: *Deleted

## 2016-11-12 ENCOUNTER — Ambulatory Visit
Admission: RE | Admit: 2016-11-12 | Discharge: 2016-11-12 | Disposition: A | Discharge status: Home / Self Care | Payer: BLUE CROSS/BLUE SHIELD | Source: Ambulatory Visit | Attending: Urology | Admitting: Urology

## 2016-11-12 DIAGNOSIS — G4730 Sleep apnea, unspecified: Secondary | ICD-10-CM | POA: Insufficient documentation

## 2016-11-12 DIAGNOSIS — K219 Gastro-esophageal reflux disease without esophagitis: Secondary | ICD-10-CM | POA: Diagnosis not present

## 2016-11-12 DIAGNOSIS — R011 Cardiac murmur, unspecified: Secondary | ICD-10-CM | POA: Insufficient documentation

## 2016-11-12 DIAGNOSIS — I350 Nonrheumatic aortic (valve) stenosis: Secondary | ICD-10-CM | POA: Diagnosis not present

## 2016-11-12 DIAGNOSIS — N433 Hydrocele, unspecified: Secondary | ICD-10-CM | POA: Insufficient documentation

## 2016-11-12 DIAGNOSIS — Z87891 Personal history of nicotine dependence: Secondary | ICD-10-CM | POA: Insufficient documentation

## 2016-11-12 DIAGNOSIS — Z952 Presence of prosthetic heart valve: Secondary | ICD-10-CM | POA: Diagnosis not present

## 2016-11-12 DIAGNOSIS — I10 Essential (primary) hypertension: Secondary | ICD-10-CM | POA: Diagnosis not present

## 2016-11-12 DIAGNOSIS — I252 Old myocardial infarction: Secondary | ICD-10-CM | POA: Diagnosis not present

## 2016-11-12 DIAGNOSIS — N5089 Other specified disorders of the male genital organs: Secondary | ICD-10-CM | POA: Diagnosis not present

## 2016-11-12 DIAGNOSIS — Z7982 Long term (current) use of aspirin: Secondary | ICD-10-CM | POA: Diagnosis not present

## 2016-11-12 DIAGNOSIS — N400 Enlarged prostate without lower urinary tract symptoms: Secondary | ICD-10-CM | POA: Diagnosis not present

## 2016-11-12 DIAGNOSIS — I4891 Unspecified atrial fibrillation: Secondary | ICD-10-CM | POA: Insufficient documentation

## 2016-11-12 DIAGNOSIS — N491 Inflammatory disorders of spermatic cord, tunica vaginalis and vas deferens: Secondary | ICD-10-CM | POA: Diagnosis not present

## 2016-11-12 DIAGNOSIS — E785 Hyperlipidemia, unspecified: Secondary | ICD-10-CM | POA: Insufficient documentation

## 2016-11-12 DIAGNOSIS — Z79899 Other long term (current) drug therapy: Secondary | ICD-10-CM | POA: Insufficient documentation

## 2016-11-12 HISTORY — PX: VASECTOMY: SHX75

## 2016-11-12 HISTORY — PX: EPIDIDYMECTOMY: SHX6275

## 2016-11-12 SURGERY — EPIDIDYMECTOMY
Anesthesia: General | Site: Scrotum | Laterality: Right | Wound class: Clean

## 2016-11-12 MED ORDER — PHENYLEPHRINE HCL 10 MG/ML IJ SOLN
INTRAMUSCULAR | Status: DC | PRN
  Administered 2016-11-12 (×2): 50 ug via INTRAVENOUS
  Administered 2016-11-12 (×2): 100 ug via INTRAVENOUS
  Administered 2016-11-12 (×3): 50 ug via INTRAVENOUS
  Administered 2016-11-12: 100 ug via INTRAVENOUS
  Administered 2016-11-12: 50 ug via INTRAVENOUS
  Administered 2016-11-12: 100 ug via INTRAVENOUS

## 2016-11-12 MED ORDER — ONDANSETRON HCL 4 MG/2ML IJ SOLN
INTRAMUSCULAR | Status: DC | PRN
  Administered 2016-11-12: 4 mg via INTRAVENOUS

## 2016-11-12 MED ORDER — OXYCODONE HCL 5 MG/5ML PO SOLN: 5.0000 mg | Freq: Once | ORAL | Status: DC | PRN

## 2016-11-12 MED ORDER — FAMOTIDINE 20 MG PO TABS
ORAL_TABLET | ORAL | Status: AC
  Filled 2016-11-12: qty 1

## 2016-11-12 MED ORDER — DEXAMETHASONE SODIUM PHOSPHATE 10 MG/ML IJ SOLN
INTRAMUSCULAR | Status: DC | PRN
  Administered 2016-11-12: 4 mg via INTRAVENOUS

## 2016-11-12 MED ORDER — OXYCODONE HCL 5 MG PO TABS: 5.0000 mg | ORAL_TABLET | Freq: Once | ORAL | Status: DC | PRN

## 2016-11-12 MED ORDER — FENTANYL CITRATE (PF) 100 MCG/2ML IJ SOLN: 25.0000 ug | INTRAMUSCULAR | Status: DC | PRN

## 2016-11-12 MED ORDER — HYDROCODONE-ACETAMINOPHEN 5-325 MG PO TABS: 1.0000 | ORAL_TABLET | Freq: Four times a day (QID) | ORAL | 0 refills | Status: DC | PRN

## 2016-11-12 MED ORDER — BUPIVACAINE HCL (PF) 0.5 % IJ SOLN
INTRAMUSCULAR | Status: AC
  Filled 2016-11-12: qty 30

## 2016-11-12 MED ORDER — LIDOCAINE HCL (CARDIAC) 20 MG/ML IV SOLN
INTRAVENOUS | Status: DC | PRN
  Administered 2016-11-12: 60 mg via INTRAVENOUS

## 2016-11-12 MED ORDER — PROPOFOL 10 MG/ML IV BOLUS
INTRAVENOUS | Status: DC | PRN
  Administered 2016-11-12: 180 mg via INTRAVENOUS

## 2016-11-12 MED ORDER — DOCUSATE SODIUM 100 MG PO CAPS: 100.0000 mg | ORAL_CAPSULE | Freq: Two times a day (BID) | ORAL | 0 refills | Status: DC

## 2016-11-12 MED ORDER — BUPIVACAINE HCL 0.5 % IJ SOLN
INTRAMUSCULAR | Status: DC | PRN
  Administered 2016-11-12: 16 mL

## 2016-11-12 MED ORDER — KETOROLAC TROMETHAMINE 30 MG/ML IJ SOLN
INTRAMUSCULAR | Status: DC | PRN
  Administered 2016-11-12: 30 mg via INTRAVENOUS

## 2016-11-12 MED ORDER — MIDAZOLAM HCL 2 MG/2ML IJ SOLN
INTRAMUSCULAR | Status: DC | PRN
  Administered 2016-11-12: 2 mg via INTRAVENOUS

## 2016-11-12 MED ORDER — LACTATED RINGERS IV SOLN
INTRAVENOUS | Status: DC
  Administered 2016-11-12: 07:00:00 via INTRAVENOUS

## 2016-11-12 MED ORDER — CEFAZOLIN SODIUM-DEXTROSE 2-4 GM/100ML-% IV SOLN
INTRAVENOUS | Status: AC
  Filled 2016-11-12: qty 100

## 2016-11-12 MED ORDER — FENTANYL CITRATE (PF) 100 MCG/2ML IJ SOLN
INTRAMUSCULAR | Status: DC | PRN
  Administered 2016-11-12: 25 ug via INTRAVENOUS
  Administered 2016-11-12: 50 ug via INTRAVENOUS
  Administered 2016-11-12 (×2): 25 ug via INTRAVENOUS

## 2016-11-12 MED ORDER — EPHEDRINE SULFATE 50 MG/ML IJ SOLN
INTRAMUSCULAR | Status: DC | PRN
  Administered 2016-11-12 (×5): 5 mg via INTRAVENOUS

## 2016-11-12 SURGICAL SUPPLY — 41 items
BLADE SURG 15 STRL LF DISP TIS (BLADE) ×2 IMPLANT
BLADE SURG 15 STRL SS (BLADE) ×2
CANISTER SUCT 1200ML W/VALVE (MISCELLANEOUS) ×4 IMPLANT
CLOSURE WOUND 1/2 X4 (GAUZE/BANDAGES/DRESSINGS) ×1
DERMABOND ADVANCED (GAUZE/BANDAGES/DRESSINGS) ×2
DERMABOND ADVANCED .7 DNX12 (GAUZE/BANDAGES/DRESSINGS) ×2 IMPLANT
DRAIN PENROSE 1/4X12 LTX (DRAIN) ×4 IMPLANT
DRAIN PENROSE 5/8X18 LTX STRL (WOUND CARE) ×4 IMPLANT
DRAPE LAPAROTOMY 100X77 ABD (DRAPES) ×4 IMPLANT
DRESSING TELFA 4X3 1S ST N-ADH (GAUZE/BANDAGES/DRESSINGS) ×4 IMPLANT
DRSG TEGADERM 4X4.75 (GAUZE/BANDAGES/DRESSINGS) ×4 IMPLANT
ELECT REM PT RETURN 9FT ADLT (ELECTROSURGICAL) ×4
ELECTRODE REM PT RTRN 9FT ADLT (ELECTROSURGICAL) ×2 IMPLANT
GAUZE FLUFF 18X24 1PLY STRL (GAUZE/BANDAGES/DRESSINGS) ×4 IMPLANT
GLOVE BIO SURGEON STRL SZ 6.5 (GLOVE) ×3 IMPLANT
GLOVE BIO SURGEONS STRL SZ 6.5 (GLOVE) ×1
GLOVE INDICATOR 7.0 STRL GRN (GLOVE) ×4 IMPLANT
GOWN STRL REUS W/ TWL LRG LVL3 (GOWN DISPOSABLE) ×4 IMPLANT
GOWN STRL REUS W/TWL LRG LVL3 (GOWN DISPOSABLE) ×4
KIT RM TURNOVER STRD PROC AR (KITS) ×4 IMPLANT
LABEL OR SOLS (LABEL) ×4 IMPLANT
NEEDLE HYPO 25GX1X1/2 BEV (NEEDLE) ×4 IMPLANT
NS IRRIG 500ML POUR BTL (IV SOLUTION) ×4 IMPLANT
PACK BASIN MINOR ARMC (MISCELLANEOUS) ×4 IMPLANT
PREP PVP WINGED SPONGE (MISCELLANEOUS) ×4 IMPLANT
SET INTRO CAPELLA COAXIAL (SET/KITS/TRAYS/PACK) ×4 IMPLANT
SOL PREP PVP 2OZ (MISCELLANEOUS) ×4
SOLUTION PREP PVP 2OZ (MISCELLANEOUS) ×2 IMPLANT
SPONGE KITTNER 5P (MISCELLANEOUS) ×4 IMPLANT
STRIP CLOSURE SKIN 1/2X4 (GAUZE/BANDAGES/DRESSINGS) ×3 IMPLANT
SUPPORT SCROTAL LRG (SOFTGOODS) ×1
SUPPORT SCROTAL LRG NO STRP (SOFTGOODS) ×3 IMPLANT
SUT CHROMIC 3 0 PS 2 (SUTURE) ×4 IMPLANT
SUT CHROMIC 3 0 SH 27 (SUTURE) IMPLANT
SUT CHROMIC 4 0 SH 27 (SUTURE) IMPLANT
SUT CHROMIC BR 1/2CLE 2-0 54IN (SUTURE) IMPLANT
SUT PLAIN 3 0 SH 27IN (SUTURE) IMPLANT
SUT SILK 2 0 SH (SUTURE) IMPLANT
SUT SURGILON NYL 2 0 T19 M (SUTURE) IMPLANT
SUT VIC AB 4-0 PS2 18 (SUTURE) ×12 IMPLANT
SYR 10ML LL (SYRINGE) ×4 IMPLANT

## 2016-11-12 NOTE — Op Note (Signed)
Date of procedure: 11/12/16  Preoperative diagnosis:  1. Right extratesticular mass   Postoperative diagnosis:  1. Dilated right convoluted vas, tail of the epididymis   Procedure: 1. Right vasectomy 2. Right partial epididymectomy 3. Scrotal exploration  Surgeon: [name], MD  Anesthesia: General  Complications: None  Intraoperative findings: Dilated, rugated, firm convoluted vas with indurated, dilated tail and body of right epididymis. No other extra testicular lesions appreciated. Normal right testicle.  EBL: Minimal  Specimens: Right convoluted size with epididymal tail and body  Drains: None  Indication: Adam Zavala. is a 69 y.o. patient with right extratesticular mass first identified in 03/2016 which failed to resolve with antibiotics. This remained essentially stable in size until now, nonpainful.  Scrotal ultrasound showed a 1.7 x 2.3 x 2.6 mass separate from the epididymal head.  After reviewing the management options for treatment, he elected to proceed with the above surgical procedure(s). We have discussed the potential benefits and risks of the procedure, side effects of the proposed treatment, the likelihood of the patient achieving the goals of the procedure, and any potential problems that might occur during the procedure or recuperation. Informed consent has been obtained.  Description of procedure:  The patient was taken to the operating room and general anesthesia was induced.  The patient was placed in the supine position, prepped and draped in the usual sterile fashion, and preoperative antibiotics were administered. A preoperative time-out was performed.   At this point in time, an exam under anesthesia was performed. The area of concern felt to be consistent with a convoluted vas extending up to the epididymal tail and body which was extremely firm, indurated, dilated, and rugated. This was distinctly different from the left side. No other testicular  or extratesticular lesions were identified bilaterally. At this point time, approximately 5 cm incision was created vertically in the midline along the median raphae. Prior to making this incision, Marcaine was used copiously to anesthetize the wound.  The incision was carried then down through the subcutaneous tissues and her toes using Bovie electrocautery. The septum was divided down towards the left testicle. The left testicle was then delivered through the incision and tunical vaginalis was opened.  The testicle itself appeared grossly normal. As previously palpated, the convoluted vas was extremely dilated and thickened following it up towards the tail of the epididymis.  The body and head of the epididymis was unremarkable. The previous vasectomy site was identified and all of the pathology was identified on the testicular end of the transected vas. At this point in time, the cut end of the testicular convoluted tubule was identified and isolated. The convoluted portion of the vas was then carefully dissected away from the surrounding structures with care taken to avoid any injury to other cord structures especially the testicular artery and veins. The dissection was carried up to the level of the mid section of the body of the epididymis where the remainder of the structure including the head was appreciably normal. The decision was made to proceed with only partial epididymectomy and resection of the abnormal areas. Vicryl tie was used 2 to time the area within the mid epididymis which was normal. The epididymis was transected between these 2 ties.  After careful and precise dissection using fine sharp dissecting forceps, the entire specimen was able to be freed up and delivered.  At this point time, careful hemostasis was achieved. The testicle again was reinspected and noted to be well perfused. There is  no active bleeding noted. Within the cord structures, a pulsation could be palpated consistent with  good testicular artery flow. The testicle with intact testicular head and portion of the body was then placed back within the  tunica vaginalis and tunical vaginalis was then closed using a running 3-0 Vicryl. Testicle was then placed back into its normal anatomic position within the right hemiscrotum. Careful hemostasis was achieved within the dartos muscle prior to replacing the testicle. The wound was carefully irrigated using saline. Finally, Dr.) was closed in running fashion using a 3-0 Vicryl suture. Skin was then closed using a simple interrupted 4-0 chromic suture. Additional dressing of Dermabond was applied. After this was allowed adequately dry, scrotal fluffs and a scrotal support eyes were applied. The patient was then reversed from anesthesia, taken to the PACU in stable condition.  The specimen was transected in approximately the middle and a 5 mm slice of the tail of the epididymis was sent off for tissue culture. The remainder of the 2 segments of specimen were sent for pathologic analysis.  Plan: I'll call the patient with his pathology and wound culture results. He'll follow up in 4 weeks for wound check.   [name], M.D.

## 2016-11-12 NOTE — Anesthesia Postprocedure Evaluation (Signed)
Anesthesia Post Note  Patient: Adam Zavala.  Procedure(s) Performed: Procedure(s) (LRB): PARTIAL EPIDIDYMECTOMY (Right) VASECTOMY (Right)  Patient location during evaluation: PACU Anesthesia Type: General Level of consciousness: awake and alert Pain management: pain level controlled Vital Signs Assessment: post-procedure vital signs reviewed and stable Respiratory status: spontaneous breathing, nonlabored ventilation, respiratory function stable and patient connected to nasal cannula oxygen Cardiovascular status: blood pressure returned to baseline and stable Postop Assessment: no signs of nausea or vomiting Anesthetic complications: no    Last Vitals:  Vitals:   11/12/16 1033 11/12/16 1059  BP: 124/85 133/82  Pulse: 65 78  Resp: 16 16  Temp: 36.1 C     Last Pain:  Vitals:   11/12/16 1059  TempSrc:   PainSc: 2                  Precious Haws Piscitello

## 2016-11-12 NOTE — Anesthesia Procedure Notes (Signed)
Procedure Name: LMA Insertion Date/Time: 11/12/2016 7:51 AM Performed by: Hedda Slade Pre-anesthesia Checklist: Patient identified, Emergency Drugs available, Patient being monitored and Suction available Patient Re-evaluated:Patient Re-evaluated prior to inductionOxygen Delivery Method: Circle system utilized Preoxygenation: Pre-oxygenation with 100% oxygen Intubation Type: IV induction Ventilation: Mask ventilation without difficulty LMA: LMA inserted LMA Size: 4.5 Number of attempts: 1 Placement Confirmation: positive ETCO2 and breath sounds checked- equal and bilateral Tube secured with: Tape Dental Injury: Teeth and Oropharynx as per pre-operative assessment

## 2016-11-12 NOTE — Discharge Instructions (Signed)
Scrotal Surgery, Adult, Care After This sheet gives you information about how to care for yourself after your procedure. Your health care provider may also give you more specific instructions. If you have problems or questions, contact your health care provider. What can I expect after the procedure? After your procedure, it is common to have mild discomfort, swelling, and bruising in the pouch that holds your testicles (scrotum). Follow these instructions at home: Bathing  Ask your health care provider when you can shower, take baths, or go swimming.  If you were told to wear an athletic support strap, take it off when you shower or take a bath. Incision care  Follow instructions from your health care provider about how to take care of your incision. Make sure you:  Wash your hands with soap and water before you change your bandage (dressing). If soap and water are not available, use hand sanitizer.  Change your dressing as told by your health care provider.  Leave stitches (sutures) in place.  Check your incision and scrotum every day for signs of infection. Check for:  More redness, swelling, or pain.  Blood or fluid.  Warmth.  Pus or a bad smell. Managing pain, stiffness, and swelling  If directed, apply ice to the injured area:  Put ice in a plastic bag.  Place a towel between your skin and the bag.  Leave the ice on for 20 minutes, 2-3 times per day. Driving  Do not drive for 24 hours if you were given a sedative.  Do not drive or use heavy machinery while taking prescription pain medicine.  Ask your health care provider when it is safe to drive. Activity  Do not do any activities that require great strength and energy (are vigorous) for as long as told by your health care provider.  Return to your normal activities as told by your health care provider. Ask your health care provider what activities are safe for you.  Do not lift anything that is heavier  than 10 lb (4.5 kg) until your health care provider says that it is safe. General instructions  Take over-the-counter and prescription medicines only as told by your health care provider.  Keep all follow-up visits as told by your health care provider. This is important.  If you were given an athletic support strap, wear it as told by your health care provider.  If you had a drain put in during the procedure, you will need to return for a follow-up visit to have it removed. Contact a health care provider if:  Your pain is not controlled with medicine.  You have more redness or swelling around your scrotum.  You have blood or fluid coming from your scrotum.  Your incision feels warm to the touch.  You have pus or a bad smell coming from your scrotum.  You have a fever.   Take Extra-Strength Tylenol, Motrin or Advil as needed for any pain or discomfort. Follow the package directions regarding dose. Stronger prescription pain medication is provided, but most people do not find that it is necessary. If you experience unusual or severe pain that is not relieved by pain medication, excessive bleeding or drainage, excessive swelling or redness, foul odor or a fever over 101 F, please contact our office.  [facility] [address], [place], [place] [address] [phone]     AMBULATORY SURGERY  DISCHARGE INSTRUCTIONS   1) The drugs that you were given will stay in  your system until tomorrow so for the next 24 hours you should not:  A) Drive an automobile B) Make any legal decisions C) Drink any alcoholic beverage   2) You may resume regular meals tomorrow.  Today it is better to start with liquids and gradually work up to solid foods.  You may eat anything you prefer, but it is better to start with liquids, then soup and crackers, and gradually work up to solid foods.   3) Please notify your doctor immediately if you have any unusual  bleeding, trouble breathing, redness and pain at the surgery site, drainage, fever, or pain not relieved by medication.    4) Additional Instructions:        Please contact your physician with any problems or Same Day Surgery at [phone], Monday through Friday 6 am to 4 pm, or [place] at [facility] number at [phone].

## 2016-11-12 NOTE — Transfer of Care (Signed)
Immediate Anesthesia Transfer of Care Note  Patient: Adam Zavala.  Procedure(s) Performed: Procedure(s): PARTIAL EPIDIDYMECTOMY (Right) VASECTOMY (Right)  Patient Location: PACU  Anesthesia Type:General  Level of Consciousness: awake and alert   Airway & Oxygen Therapy: Patient Spontanous Breathing and Patient connected to face mask oxygen  Post-op Assessment: Report given to RN and Post -op Vital signs reviewed and stable  Post vital signs: Reviewed and stable  Last Vitals:  Vitals:   11/12/16 0639 11/12/16 0946  BP: (!) 153/89 124/76  Pulse: 71 89  Resp: 18 10  Temp: 36.8 C 36.3 C    Last Pain:  Vitals:   11/12/16 0639  TempSrc: Oral         Complications: No apparent anesthesia complications

## 2016-11-12 NOTE — H&P (Signed)
Progress Notes Encounter Date: 06/13/2016 12:58 PM  --> updated on 11/12/16 at 7:30 am without change       12:58 PM  06/13/2016   Adam Zavala June 26, 1946 [id number]  Referring provider: Corey Skains, MD [place], [place] [address]      Chief Complaint  Patient presents with  . Epididymal Mass    1 month    HPI:  70 year old Male who presents for follow-up for right extratesticular scrotal mass.   Right extratesticular scrotal mass Initially seen and evaluated approximately one month ago for right extratesticular scrotal mass. At that time, he is having right testicular discomfort.  Scrotal ultrasound showed a 1.7 x 2.2 x 2.6 solid and cystic lesion adjacent to the epididymis of unclear etiology.  He was treated with extended course of Bactrim while away on vacation in [place]. He reports that the tenderness has completely resolved.   Last visit ~4 weeks ago shows slightly decreased size of lesion which appeared to be contiguous with the tail of the right epididymis, perhaps slightly smaller than initial presentation.  No overlying skin changes, erythema, or drainage. He has no complaints today.  No pain. He thinks that the lesion is unchanged from 1 month ago.  Of note, he does do a lot of traveling including to foreign countries. No personal history of tuberculosis.  He does have a history of a biologic aortic valve, and ASA 81 mg. Followed by Dr. Drema Dallas regularly.  History of BPH/ history of elevated PSA He is having history of BPH on Flomax. He was seen by urologist proximal May 5 years ago, Dr. cope at [facility] for an episode of elevated PSA which rose from 0.62  to 3.8 leading to a negative prostate biopsy.  His most recent PSA was 1.4 on 04/03/2016.  He denies any urinary symptoms including dysuria, hematuria, urinary urgency or frequency.   PMH:      Past Medical History  Diagnosis Date  . Aortic stenosis   .  Hypertension   . Hyperlipidemia   . GERD (gastroesophageal reflux disease)   . A-fib ([facility])     now in normal sinus rhytm  . Sleep apnea   . Aortic valve replaced 03/2013  . Heart attack ([place])   . Heart murmur     Surgical History:       Past Surgical History  Procedure Laterality Date  . Sphincterotomy    . Partial removal of colon    . Right shoulder surgery      separation/ dislocated  . Rotator cuff repair    . Prostate biopsy    . Colonoscopy w/ biopsies and polypectomy      3 polyps removed  . Achilles tendon rupture repair      Home Medications:        Medication List       This list is accurate as of: 06/13/16 12:58 PM.  Always use your most recent med list.                aspirin 81 MG tablet  Take 81 mg by mouth daily.     atorvastatin 80 MG tablet  Commonly known as:  LIPITOR  Take by mouth.     esomeprazole 40 MG capsule  Commonly known as:  NEXIUM  Take 40 mg by mouth daily before breakfast.     lisinopril 10 MG tablet  Commonly known as:  PRINIVIL,ZESTRIL  Take 10 mg by mouth daily.  Takes 15 mgm     tamsulosin 0.4 MG Caps capsule  Commonly known as:  FLOMAX  Take 0.4 mg by mouth daily.        Allergies: No Known Allergies  Family History:      Family History  Problem Relation Age of Onset  . Bladder Cancer Neg Hx   . Prostate cancer Neg Hx   . Kidney cancer Neg Hx     Social History:  reports that he has quit smoking. His smoking use included Cigars. He has never used smokeless tobacco. He reports that he drinks alcohol. He reports that he does not use illicit drugs.  ROS: UROLOGY Frequent Urination?: No Hard to postpone urination?: No Burning/pain with urination?: No Get up at night to urinate?: No Leakage of urine?: No Urine stream starts and stops?: No Trouble starting stream?: No Do you have to strain to urinate?: No Blood in urine?: No Urinary tract  infection?: No Sexually transmitted disease?: No Injury to kidneys or bladder?: No Painful intercourse?: No Weak stream?: No Erection problems?: No Penile pain?: No  Gastrointestinal Nausea?: No Vomiting?: No Indigestion/heartburn?: No Diarrhea?: No Constipation?: No  Constitutional Fever: No Night sweats?: No Weight loss?: No Fatigue?: No  Skin Skin rash/lesions?: No Itching?: No  Eyes Blurred vision?: No Double vision?: No  Ears/Nose/Throat Sore throat?: No Sinus problems?: No  Hematologic/Lymphatic Swollen glands?: No Easy bruising?: No  Cardiovascular Leg swelling?: No Chest pain?: No  Respiratory Cough?: No Shortness of breath?: No  Endocrine Excessive thirst?: No  Musculoskeletal Back pain?: No Joint pain?: No  Neurological Headaches?: No Dizziness?: No  Psychologic Depression?: No Anxiety?: No  Physical Exam: BP 151/82 mmHg  Pulse 69  Ht 6\' 1"  (1.854 m)  Wt 267 lb (121.11 kg)  BMI 35.23 kg/m2  Constitutional:  Alert and oriented, No acute distress. HEENT: [place] AT, moist mucus membranes.  Trachea midline, no masses. Cardiovascular: No clubbing, cyanosis, or edema. RRR Respiratory: Normal respiratory effort, no increased work of breathing. CTAB. GI: Abdomen is soft, nontender, nondistended, no abdominal masses GU: No CVA tenderness. Circumcised phallus with orthotopic meatus. Scrotum unremarkable without any erythema, edema, or induration. Left testicle normal without masses. Right testicle well-circumscribed, normal without any intratesticular masses. Firm, approximately 1 cm spherical right extratesticular mass which appears to be contiguous with the tail of the right epididymis. The firmness extends posteriorly in a linear fashion approximately 1 cm it has a rugated texture along the inferior body of the epidiymis.  Non tender.  No overlying skin changes.  This mass appears to be discretely separate from the right testicle.   This exam is essentally unchanged from 1 month ago. Skin: No rashes, bruises or suspicious lesions. Lymph: No cervical or inguinal adenopathy. Neurologic: Grossly intact, no focal deficits, moving all 4 extremities. Psychiatric: Normal mood and affect.  Laboratory Data: Labs from 04/03/2016 reviewed Creatinine 1.3 WBC 4.9 Hemoglobin 15.1 Platelets 129 Hbg A1c 6.1   Urinalysis UA from 04/03/2016 negative without evidence of blood, WBC, or nitrites. Urine culture growing less than 10,000 colonies, not speciated.  Pertinent Imaging:  Study Result     CLINICAL DATA: 70 year old with right scrotal mass for 1 month. Slight soreness. No known injury.  EXAM: SCROTAL ULTRASOUND  DOPPLER ULTRASOUND OF THE TESTICLES  TECHNIQUE: Complete ultrasound examination of the testicles, epididymis, and other scrotal structures was performed. Color and spectral Doppler ultrasound were also utilized to evaluate blood flow to the testicles.  COMPARISON: Pelvic CT 12/12/2015.  FINDINGS: Right testicle  Measurements: 4.5 x 2.5 x 3.5 cm. No testicular mass demonstrated. There is normal blood flow with color Doppler.  Left testicle  Measurements: 4.7 x 2.6 x 3.7 cm. No mass or microlithiasis visualized.  Right epididymis: Normal in size and appearance. There is an extra testicular mass which appears separate from the epididymis, measuring 1.7 x 2.3 x 2.6 cm. This is complex with solid and cystic areas as well as internal blood flow. No peristalsis identified.  Left epididymis: 2.5 mm epididymal cyst. Otherwise unremarkable.  Hydrocele: Small bilateral hydroceles.  Varicocele: None visualized.  Pulsed Doppler interrogation of both testes demonstrates normal low resistance arterial and venous waveforms bilaterally.  IMPRESSION: 1. Both testes appear normal. No evidence of intratesticular mass or torsion. 2. There is an extratesticular mass on the right which  appears separate from the epididymal head. Differential considerations include focal inflammation or atypical neoplasm involving the tunica vaginalis or epididymal tail. A hernia is consider less likely. Clinical follow up recommended with consideration of follow-up ultrasound or MRI for further characterization. 3. Small bilateral hydroceles.   Electronically Signed  By: Richardean Sale M.D.  On: 04/09/2016 12:23        Assessment & Plan:    1. Epididymal mass Stable right extratesticular mass which appears to be contiguous with the right epididymis. Again, I feel that this is highly unlikely to represent a malignant process especially given its stability. Suspect inflammatory or granulomatous process.  Again, we reviewed options including surgical excision versus continued observation. Although we're unable to completely rule out malignancy, this is very unlikely both given the overall extremely low incidence of extra testicular malignancies as well as its improvement on examination today.  We did review the risks and benefits of both.  At this point, he is interested in surgical excision of his right extratesticular mass/epididymis.  We've reviewed the surgical procedure in detail today including the postoperative course. We reviewed possible complications including chronic testicular pain, injury to the right testicle, right testicular compromise, hematoma, infection, discomfort, amongst others. He will need to hold aspirin 81 mg for 7 days prior to the procedure. All of his questions were answered.  2. History of elevated PSA Stable, non elevated S/p negative biopsy  3. BPH  Continue flomax  Will arrange for surgery in a few months after returns from extended vacation in [place].     [name], MD  [facility] [address], [place], [place] [address] [phone]      Electronically signed by [name],  MD at 06/13/2016 1:06 PM

## 2016-11-12 NOTE — Anesthesia Preprocedure Evaluation (Signed)
Anesthesia Evaluation  Patient identified by MRN, date of birth, ID band Patient awake    Reviewed: Allergy & Precautions, H&P , NPO status , Patient's Chart, lab work & pertinent test results  History of Anesthesia Complications Negative for: history of anesthetic complications  Airway Mallampati: III  TM Distance: <3 FB Neck ROM: limited    Dental  (+) Poor Dentition, Chipped, Caps   Pulmonary neg shortness of breath, sleep apnea and Continuous Positive Airway Pressure Ventilation , former smoker,    Pulmonary exam normal breath sounds clear to auscultation       Cardiovascular Exercise Tolerance: Good hypertension, (-) angina+ CAD, + Past MI and + Cardiac Stents  (-) DOE Normal cardiovascular exam+ Valvular Problems/Murmurs AS  Rhythm:regular Rate:Normal     Neuro/Psych negative neurological ROS  negative psych ROS   GI/Hepatic Neg liver ROS, GERD  Controlled and Medicated,  Endo/Other  negative endocrine ROS  Renal/GU      Musculoskeletal   Abdominal   Peds  Hematology negative hematology ROS (+)   Anesthesia Other Findings Patient has cardiac clearance for this procedure.   Past Medical History: No date: A-fib [facility])     Comment: now in normal sinus rhytm No date: Aortic stenosis 03/2013: Aortic valve replaced No date: GERD (gastroesophageal reflux disease) No date: Heart attack No date: Heart murmur No date: Hyperlipidemia No date: Hypertension No date: Sleep apnea  Past Surgical History: No date: achilles tendon rupture repair No date: CHOLECYSTECTOMY No date: COLON SURGERY No date: COLONOSCOPY W/ BIOPSIES AND POLYPECTOMY     Comment: 3 polyps removed No date: partial removal of colon No date: PROSTATE BIOPSY No date: right shoulder surgery     Comment: separation/ dislocated No date: ROTATOR CUFF REPAIR No date: SPHINCTEROTOMY  BMI    Body Mass Index:  33.97 kg/m      Reproductive/Obstetrics negative OB ROS                             Anesthesia Physical Anesthesia Plan  ASA: III  Anesthesia Plan: General LMA   Post-op Pain Management:    Induction:   Airway Management Planned:   Additional Equipment:   Intra-op Plan:   Post-operative Plan:   Informed Consent: I have reviewed the patients History and Physical, chart, labs and discussed the procedure including the risks, benefits and alternatives for the proposed anesthesia with the patient or authorized representative who has indicated his/her understanding and acceptance.   Dental Advisory Given  Plan Discussed with: Anesthesiologist, CRNA and Surgeon  Anesthesia Plan Comments:         Anesthesia Quick Evaluation

## 2016-11-14 LAB — SURGICAL PATHOLOGY

## 2016-11-17 LAB — AEROBIC/ANAEROBIC CULTURE W GRAM STAIN (SURGICAL/DEEP WOUND)

## 2016-11-17 LAB — AEROBIC/ANAEROBIC CULTURE (SURGICAL/DEEP WOUND): CULTURE: NO GROWTH

## 2016-12-09 NOTE — Progress Notes (Deleted)
12/10/2016 7:00 PM   Adam Zavala. [birth year] IN:[id number]  Referring provider: Corey Skains, MD [facility] [place], [place] [address]  No chief complaint on file.   HPI: Patient is a 71 year old Caucasian male who presents today for a wound recheck.  Patient is a 71 y.o. patient with right extratesticular mass first identified in 03/2016 which failed to resolve with antibiotics. This remained essentially stable in size until recently, nonpainful.  Scrotal ultrasound showed a 1.7 x 2.3 x 2.6 mass separate from the epididymal head.   He underwent right vasectomy, right scrotal exploration and right partial epididymectomy on 11/12/2016.    He post operative course was as expected and uneventful.  Pathology demonstrated VASITIS NODOSUM WITH CALCIFIED MATERIAL. NEGATIVE FOR MALIGNANCY.  Patient was contacted by phone with these results.  Today, he is ***   PMH: Past Medical History:  Diagnosis Date  . A-fib (HCC)    now in normal sinus rhytm  . Aortic stenosis   . Aortic valve replaced 03/2013  . GERD (gastroesophageal reflux disease)   . Heart attack   . Heart murmur   . Hyperlipidemia   . Hypertension   . Sleep apnea     Surgical History: Past Surgical History:  Procedure Laterality Date  . achilles tendon rupture repair    . CHOLECYSTECTOMY    . COLON SURGERY    . COLONOSCOPY W/ BIOPSIES AND POLYPECTOMY     3 polyps removed  . EPIDIDYMECTOMY Right 11/12/2016   Procedure: PARTIAL EPIDIDYMECTOMY;  Surgeon: [name], MD;  Location: [facility] ORS;  Service: Urology;  Laterality: Right;  . partial removal of colon    . PROSTATE BIOPSY    . right shoulder surgery     separation/ dislocated  . ROTATOR CUFF REPAIR    . SPHINCTEROTOMY    . VASECTOMY Right 11/12/2016   Procedure: VASECTOMY;  Surgeon: [name], MD;  Location: [facility] ORS;  Service: Urology;  Laterality: Right;    Home Medications:  Allergies as of 12/10/2016    No Known Allergies     Medication List       Accurate as of 12/09/16  7:00 PM. Always use your most recent med list.          aspirin 81 MG tablet Take 81 mg by mouth daily.   atorvastatin 80 MG tablet Commonly known as:  LIPITOR Take 80 mg by mouth every evening.   docusate sodium 100 MG capsule Commonly known as:  COLACE Take 1 capsule (100 mg total) by mouth 2 (two) times daily.   esomeprazole 40 MG capsule Commonly known as:  NEXIUM Take 40 mg by mouth daily before breakfast.   FISH OIL PO Take 1 capsule by mouth once a week.   HYDROcodone-acetaminophen 5-325 MG tablet Commonly known as:  NORCO/VICODIN Take 1-2 tablets by mouth every 6 (six) hours as needed for moderate pain.   ibuprofen 200 MG tablet Commonly known as:  ADVIL,MOTRIN Take 400 mg by mouth every 6 (six) hours as needed for moderate pain.   lisinopril 10 MG tablet Commonly known as:  PRINIVIL,ZESTRIL Take 10 mg by mouth daily.   tamsulosin 0.4 MG Caps capsule Commonly known as:  FLOMAX Take 0.4 mg by mouth daily.       Allergies: No Known Allergies  Family History: Family History  Problem Relation Age of Onset  . Bladder Cancer Neg Hx   . Prostate cancer Neg Hx   .  Kidney cancer Neg Hx     Social History:  reports that he has quit smoking. His smoking use included Cigars. He has never used smokeless tobacco. He reports that he drinks alcohol. He reports that he does not use drugs.  ROS:                                        Physical Exam: There were no vitals taken for this visit.  Constitutional: Well nourished. Alert and oriented, No acute distress. HEENT: [place] AT, moist mucus membranes. Trachea midline, no masses. Cardiovascular: No clubbing, cyanosis, or edema. Respiratory: Normal respiratory effort, no increased work of breathing. GI: Abdomen is soft, non tender, non distended, no abdominal masses. Liver and spleen not palpable.  No hernias  appreciated.  Stool sample for occult testing is not indicated.   GU: No CVA tenderness.  No bladder fullness or masses.  Patient with circumcised/uncircumcised phallus. ***Foreskin easily retracted***  Urethral meatus is patent.  No penile discharge. No penile lesions or rashes. Scrotum without lesions, cysts, rashes and/or edema.  Testicles are located scrotally bilaterally. No masses are appreciated in the testicles. Left and right epididymis are normal. Rectal: Patient with  normal sphincter tone. Anus and perineum without scarring or rashes. No rectal masses are appreciated. Prostate is approximately *** grams, *** nodules are appreciated. Seminal vesicles are normal. Skin: No rashes, bruises or suspicious lesions. Lymph: No cervical or inguinal adenopathy. Neurologic: Grossly intact, no focal deficits, moving all 4 extremities. Psychiatric: Normal mood and affect.  Laboratory Data: Lab Results  Component Value Date   HGB 14.7 10/24/2016     Assessment & Plan:    1. Right epididymal mass  - excised and non malignant  2. BPH  - continue Flomax  - PSA's stable most recent PSA was 1.4 on 04/03/2016.    No Follow-up on file.  These notes generated with voice recognition software. I apologize for typographical errors.  Zara Council, [facility] [address], [place], [place] [address] [phone]

## 2016-12-10 ENCOUNTER — Ambulatory Visit: Payer: BLUE CROSS/BLUE SHIELD | Admitting: Urology

## 2017-01-24 DIAGNOSIS — M7732 Calcaneal spur, left foot: Secondary | ICD-10-CM | POA: Diagnosis not present

## 2017-01-24 DIAGNOSIS — M7662 Achilles tendinitis, left leg: Secondary | ICD-10-CM | POA: Diagnosis not present

## 2017-02-18 DIAGNOSIS — M7662 Achilles tendinitis, left leg: Secondary | ICD-10-CM | POA: Diagnosis not present

## 2017-02-18 DIAGNOSIS — M7732 Calcaneal spur, left foot: Secondary | ICD-10-CM | POA: Diagnosis not present

## 2017-03-25 DIAGNOSIS — M7732 Calcaneal spur, left foot: Secondary | ICD-10-CM | POA: Diagnosis not present

## 2017-03-25 DIAGNOSIS — M7662 Achilles tendinitis, left leg: Secondary | ICD-10-CM | POA: Diagnosis not present

## 2017-03-25 DIAGNOSIS — R208 Other disturbances of skin sensation: Secondary | ICD-10-CM | POA: Diagnosis not present

## 2017-03-25 DIAGNOSIS — L538 Other specified erythematous conditions: Secondary | ICD-10-CM | POA: Diagnosis not present

## 2017-03-25 DIAGNOSIS — Z85828 Personal history of other malignant neoplasm of skin: Secondary | ICD-10-CM | POA: Diagnosis not present

## 2017-03-25 DIAGNOSIS — L820 Inflamed seborrheic keratosis: Secondary | ICD-10-CM | POA: Diagnosis not present

## 2017-03-25 DIAGNOSIS — Z08 Encounter for follow-up examination after completed treatment for malignant neoplasm: Secondary | ICD-10-CM | POA: Diagnosis not present

## 2017-05-07 DIAGNOSIS — I350 Nonrheumatic aortic (valve) stenosis: Secondary | ICD-10-CM | POA: Diagnosis not present

## 2017-05-07 DIAGNOSIS — I10 Essential (primary) hypertension: Secondary | ICD-10-CM | POA: Diagnosis not present

## 2017-05-07 DIAGNOSIS — I2510 Atherosclerotic heart disease of native coronary artery without angina pectoris: Secondary | ICD-10-CM | POA: Diagnosis not present

## 2017-05-07 DIAGNOSIS — I4891 Unspecified atrial fibrillation: Secondary | ICD-10-CM | POA: Diagnosis not present

## 2017-05-07 DIAGNOSIS — E781 Pure hyperglyceridemia: Secondary | ICD-10-CM | POA: Diagnosis not present

## 2017-05-07 DIAGNOSIS — G4733 Obstructive sleep apnea (adult) (pediatric): Secondary | ICD-10-CM | POA: Diagnosis not present

## 2017-08-10 ENCOUNTER — Emergency Department
Admission: EM | Admit: 2017-08-10 | Discharge: 2017-08-10 | Disposition: A | Discharge status: Home / Self Care | Payer: Medicare Other | Attending: Emergency Medicine | Admitting: Emergency Medicine

## 2017-08-10 ENCOUNTER — Encounter: Payer: Self-pay | Admitting: Emergency Medicine

## 2017-08-10 DIAGNOSIS — I2510 Atherosclerotic heart disease of native coronary artery without angina pectoris: Secondary | ICD-10-CM | POA: Insufficient documentation

## 2017-08-10 DIAGNOSIS — Z7982 Long term (current) use of aspirin: Secondary | ICD-10-CM | POA: Diagnosis not present

## 2017-08-10 DIAGNOSIS — Z79899 Other long term (current) drug therapy: Secondary | ICD-10-CM | POA: Diagnosis not present

## 2017-08-10 DIAGNOSIS — I10 Essential (primary) hypertension: Secondary | ICD-10-CM | POA: Diagnosis not present

## 2017-08-10 DIAGNOSIS — Z87891 Personal history of nicotine dependence: Secondary | ICD-10-CM | POA: Diagnosis not present

## 2017-08-10 LAB — CBC WITH DIFFERENTIAL/PLATELET
Basophils Absolute: 0 10*3/uL (ref 0–0.1)
Basophils Relative: 0 %
EOS ABS: 0.1 10*3/uL (ref 0–0.7)
Eosinophils Relative: 2 %
HEMATOCRIT: 45.9 % (ref 40.0–52.0)
HEMOGLOBIN: 16.2 g/dL (ref 13.0–18.0)
Lymphocytes Relative: 28 %
Lymphs Abs: 1.6 10*3/uL (ref 1.0–3.6)
MCH: 32.7 pg (ref 26.0–34.0)
MCHC: 35.4 g/dL (ref 32.0–36.0)
MCV: 92.3 fL (ref 80.0–100.0)
MONO ABS: 0.5 10*3/uL (ref 0.2–1.0)
MONOS PCT: 9 %
NEUTROS PCT: 61 %
Neutro Abs: 3.4 10*3/uL (ref 1.4–6.5)
Platelets: 158 10*3/uL (ref 150–440)
RBC: 4.97 MIL/uL (ref 4.40–5.90)
RDW: 12.8 % (ref 11.5–14.5)
WBC: 5.6 10*3/uL (ref 3.8–10.6)

## 2017-08-10 LAB — COMPREHENSIVE METABOLIC PANEL
ALK PHOS: 57 U/L (ref 38–126)
ALT: 32 U/L (ref 17–63)
ANION GAP: 6 (ref 5–15)
AST: 28 U/L (ref 15–41)
Albumin: 4.1 g/dL (ref 3.5–5.0)
BUN: 17 mg/dL (ref 6–20)
CALCIUM: 9.3 mg/dL (ref 8.9–10.3)
CO2: 26 mmol/L (ref 22–32)
CREATININE: 1.17 mg/dL (ref 0.61–1.24)
Chloride: 105 mmol/L (ref 101–111)
Glucose, Bld: 109 mg/dL — ABNORMAL HIGH (ref 65–99)
Potassium: 4.3 mmol/L (ref 3.5–5.1)
Sodium: 137 mmol/L (ref 135–145)
Total Bilirubin: 0.8 mg/dL (ref 0.3–1.2)
Total Protein: 7.4 g/dL (ref 6.5–8.1)

## 2017-08-10 LAB — LIPASE, BLOOD: LIPASE: 31 U/L (ref 11–51)

## 2017-08-10 LAB — MAGNESIUM: MAGNESIUM: 2 mg/dL (ref 1.7–2.4)

## 2017-08-10 LAB — TROPONIN I

## 2017-08-10 MED ORDER — AMLODIPINE BESYLATE 10 MG PO TABS: 10.0000 mg | ORAL_TABLET | Freq: Every day | ORAL | 11 refills | Status: DC

## 2017-08-10 MED ORDER — LISINOPRIL 10 MG PO TABS: 10.0000 mg | ORAL_TABLET | Freq: Two times a day (BID) | ORAL | 11 refills | Status: DC

## 2017-08-10 MED ORDER — AMLODIPINE BESYLATE 5 MG PO TABS
10.0000 mg | ORAL_TABLET | Freq: Once | ORAL | Status: AC
  Administered 2017-08-10: 10 mg via ORAL
  Filled 2017-08-10: qty 2

## 2017-08-10 NOTE — ED Provider Notes (Signed)
[facility] Emergency Department Provider Note  ____________________________________________   First MD Initiated Contact with Patient 08/10/17 1122     (approximate)  I have reviewed the triage vital signs and the nursing notes.   HISTORY  Chief Complaint Hypertension    HPI Adam Zavala. is a 71 y.o. male with medical history as listed below who presents for evaluation of elevated blood pressure.  He states that today he felt like his blood pressure was elevated due to some pressure in his head and behind his eyes.  He has felt this sensation before and knew that it meant that his blood pressure was up in spite of taking his medication (lisinopril 10 mg by mouth).  He measured his blood pressure at home several times and all of the measurements were consistently elevated, as high as 510 systolic and 258 diastolic.  He came to the emergency department for further evaluation and management.  He reports being in his usual state of health recently with no recent illnesses or viral symptoms.  He denies visual changes, headache (just some pressure behind his eyes), chest pain, chest pressure, shortness of breath, nausea, vomiting, and abdominal pain. The discovery of the onset of his elevated blood pressure was acute and is not certain for how long it has been elevated.  He describes it as severe.  Nothing in particular makes the patient's symptoms better nor worse.     Past Medical History:  Diagnosis Date  . A-fib (HCC)    now in normal sinus rhytm  . Aortic stenosis   . Aortic valve replaced 03/2013  . GERD (gastroesophageal reflux disease)   . Heart attack ([place])   . Heart murmur   . Hyperlipidemia   . Hypertension   . Sleep apnea     Patient Active Problem List   Diagnosis Date Noted  . History of repair of rotator cuff 04/19/2016  . Atrial fibrillation ([place]) 04/10/2016  . Elevated prostate specific antigen (PSA) 04/10/2016  . HLD (hyperlipidemia)  04/10/2016  . Abdominal pain, generalized 12/07/2015  . Obstructive apnea 11/10/2014  . Arteriosclerosis of coronary artery 03/30/2014  . Hypertension   . GERD (gastroesophageal reflux disease)   . A-fib ([place])   . Aortic stenosis     Past Surgical History:  Procedure Laterality Date  . achilles tendon rupture repair    . CHOLECYSTECTOMY    . COLON SURGERY    . COLONOSCOPY W/ BIOPSIES AND POLYPECTOMY     3 polyps removed  . EPIDIDYMECTOMY Right 11/12/2016   Procedure: PARTIAL EPIDIDYMECTOMY;  Surgeon: [name], MD;  Location: [facility] ORS;  Service: Urology;  Laterality: Right;  . partial removal of colon    . PROSTATE BIOPSY    . right shoulder surgery     separation/ dislocated  . ROTATOR CUFF REPAIR    . SPHINCTEROTOMY    . VASECTOMY Right 11/12/2016   Procedure: VASECTOMY;  Surgeon: [name], MD;  Location: [facility] ORS;  Service: Urology;  Laterality: Right;    Prior to Admission medications   Medication Sig Start Date End Date Taking? Authorizing Provider  amLODipine (NORVASC) 10 MG tablet Take 1 tablet (10 mg total) by mouth daily. 08/10/17 08/10/18  Hinda Kehr, MD  aspirin 81 MG tablet Take 81 mg by mouth daily.    [provider]  atorvastatin (LIPITOR) 80 MG tablet Take 80 mg by mouth every evening.  12/21/15   [provider]  docusate sodium (COLACE) 100 MG  capsule Take 1 capsule (100 mg total) by mouth 2 (two) times daily. 11/12/16   [name], MD  esomeprazole (NEXIUM) 40 MG capsule Take 40 mg by mouth daily before breakfast.    [provider]  HYDROcodone-acetaminophen (NORCO/VICODIN) 5-325 MG tablet Take 1-2 tablets by mouth every 6 (six) hours as needed for moderate pain. 11/12/16   [name], MD  ibuprofen (ADVIL,MOTRIN) 200 MG tablet Take 400 mg by mouth every 6 (six) hours as needed for moderate pain.    [provider]  lisinopril (PRINIVIL,ZESTRIL) 10 MG tablet Take 1 tablet (10 mg total) by mouth 2 (two)  times daily. 08/10/17   Hinda Kehr, MD  Omega-3 Fatty Acids (FISH OIL PO) Take 1 capsule by mouth once a week.    [provider]  Tamsulosin HCl (FLOMAX) 0.4 MG CAPS Take 0.4 mg by mouth daily.    [provider]    Allergies Patient has no known allergies.  Family History  Problem Relation Age of Onset  . Bladder Cancer Neg Hx   . Prostate cancer Neg Hx   . Kidney cancer Neg Hx     Social History Social History  Substance Use Topics  . Smoking status: Former Smoker    Types: Cigars  . Smokeless tobacco: Never Used  . Alcohol use 0.0 oz/week     Comment: occasional     Review of Systems Constitutional: No fever/chills Eyes: No visual changes.  Pressure behind his eyes. ENT: No sore throat. Cardiovascular: Denies chest pain. Respiratory: Denies shortness of breath. Gastrointestinal: No abdominal pain.  No nausea, no vomiting.  No diarrhea.  No constipation. Genitourinary: Negative for dysuria. Musculoskeletal: Negative for neck pain.  Negative for back pain. Integumentary: Negative for rash. Neurological: Pressure behind his eyes, but otherwise negative for headaches, focal weakness or numbness.   ____________________________________________   PHYSICAL EXAM:  VITAL SIGNS: ED Triage Vitals  Enc Vitals Group     BP 08/10/17 1117 (!) 183/96     Pulse Rate 08/10/17 1117 84     Resp 08/10/17 1117 18     Temp 08/10/17 1117 98.2 F (36.8 C)     Temp Source 08/10/17 1117 Oral     SpO2 08/10/17 1117 100 %     Weight 08/10/17 1118 118.4 kg (261 lb)     Height --      Head Circumference --      Peak Flow --      Pain Score 08/10/17 1117 0     Pain Loc --      Pain Edu? --      Excl. in [place]? --     Constitutional: Alert and oriented. Well appearing and in no acute distress. Eyes: Conjunctivae are normal.  Head: Atraumatic. Nose: No congestion/rhinnorhea. Mouth/Throat: Mucous membranes are moist. Neck: No stridor.  No meningeal signs.     Cardiovascular: Normal rate, regular rhythm. Good peripheral circulation. Grossly normal heart sounds. Respiratory: Normal respiratory effort.  No retractions. Lungs CTAB. Gastrointestinal: Soft and nontender. No distention.  Musculoskeletal: No lower extremity tenderness nor edema. No gross deformities of extremities. Neurologic:  Normal speech and language. No gross focal neurologic deficits are appreciated.  Skin:  Skin is warm, dry and intact. No rash noted. Psychiatric: Mood and affect are normal. Speech and behavior are normal.  ____________________________________________   LABS (all labs ordered are listed, but only abnormal results are displayed)  Labs Reviewed  COMPREHENSIVE METABOLIC PANEL - Abnormal; Notable for the following:  Result Value   Glucose, Bld 109 (*)    All other components within normal limits  CBC WITH DIFFERENTIAL/PLATELET  MAGNESIUM  LIPASE, BLOOD  TROPONIN I   ____________________________________________  EKG  ED ECG REPORT I, Zaydin Billey, the attending physician, personally viewed and interpreted this ECG.  Date: 08/10/2017 EKG Time: 11:23 AM Rate: 76 Rhythm: normal sinus rhythm with frequent PVCs QRS Axis: normal Intervals: normal ST/T Wave abnormalities: normal Narrative Interpretation: no evidence of acute ischemia  ____________________________________________  RADIOLOGY   No results found.  ____________________________________________   PROCEDURES  Critical Care performed: No   Procedure(s) performed:   Procedures   ____________________________________________   INITIAL IMPRESSION / ASSESSMENT AND PLAN / ED COURSE  Pertinent labs & imaging results that were available during my care of the patient were reviewed by me and considered in my medical decision making (see chart for details).  in spite of the patient's hypertension, he is well-appearing and in no acute distress.  I had my usual discussion with the  patient about how we would check basic labwork including metabolic panel and troponin to look for any acute abnormalities specifically indication of end organ dysfunction such as renal compromise or elevated troponin.  He is a well established patient with Dr. Nehemiah Massed so I will contact him by phone and discussed the case and asked for specific medication recommendations.  We will not provide IV antihypertensives given that he is in no acute distress with no evidence of hypertensive urgency/emergency.  I anticipate trying an oral antihypertensive such as amlodipine and monitoring the patient but will verify with Dr. Nehemiah Massed.   Clinical Course as of Aug 10 1624  Sat Aug 10, 2017  1148 I spoke with Dr. Nehemiah Massed who agreed with my plan for evaluation and recommended that I start with amlodipine 10 mg by mouth now and a prescription for the patient to taking an starting tomorrow.  Assuming the patient is asymptomatic and shows at least some improvement in his blood pressure on the medication today, he feels the patient is appropriate for discharge and outpatient follow-up in 2 days and Dr. Alveria Apley clinic.  I updated the patient regarding the plan.  [CF]  1336 I spoke again with Dr. Nehemiah Massed who was reassured by the improvement in blood pressure.  The patient's last pressure was about 150/96.  Dr. Nehemiah Massed suggested that I increase the patient's lisinopril to 10 mg twice daily instead of once daily in addition to prescribing the amlodipine 10 mg daily.  The patient's labs are all within normal limits and he says that he has been asymptomatic since his blood pressure came down.  His plan is to follow-up with Dr. Nehemiah Massed on Monday.  He is very comfortable going home at this time and there is no evidence of any acute or emergent medical condition.  I gave my usual and customary return precautions.     [CF]    Clinical Course User Index [CF] Hinda Kehr, MD     ____________________________________________  FINAL CLINICAL IMPRESSION(S) / ED DIAGNOSES  Final diagnoses:  Essential hypertension     MEDICATIONS GIVEN DURING THIS VISIT:  Medications  amLODipine (NORVASC) tablet 10 mg (10 mg Oral Given 08/10/17 1158)     NEW OUTPATIENT MEDICATIONS STARTED DURING THIS VISIT:  Discharge Medication List as of 08/10/2017  1:41 PM    START taking these medications   Details  amLODipine (NORVASC) 10 MG tablet Take 1 tablet (10 mg total) by mouth daily., Starting Sat 08/10/2017, Until  Sun 08/10/2018, Print        Discharge Medication List as of 08/10/2017  1:41 PM    CONTINUE these medications which have CHANGED   Details  lisinopril (PRINIVIL,ZESTRIL) 10 MG tablet Take 1 tablet (10 mg total) by mouth 2 (two) times daily., Starting Sat 08/10/2017, Print        Discharge Medication List as of 08/10/2017  1:41 PM       Note:  This document was prepared using Dragon voice recognition software and may include unintentional dictation errors.    Hinda Kehr, MD 08/10/17 [phone]

## 2017-08-10 NOTE — Discharge Instructions (Signed)
As we discussed, though you do have high blood pressure (hypertension), fortunately it is not immediately dangerous at this time and does not require admission to the hospital.  We made some medication changes:  we added amlodipine 10 mg daily, and increased your frequency of lisinopril to 10 mg twice daily.  Please follow up in clinic as recommended in these papers.    Return to the Emergency Department (ED) if you experience any worsening chest pain/pressure/tightness, difficulty breathing, or sudden sweating, or other symptoms that concern you.

## 2017-08-10 NOTE — ED Triage Notes (Signed)
Pt to ed with c/o HTN,  Pt denies chest pain, denies blurred vision, denies headache.  Pt states he felt "pressure" behind his eyes today and checked his blood pressure, reports it was elevated.

## 2017-08-12 DIAGNOSIS — I10 Essential (primary) hypertension: Secondary | ICD-10-CM | POA: Diagnosis not present

## 2017-08-12 DIAGNOSIS — E782 Mixed hyperlipidemia: Secondary | ICD-10-CM | POA: Diagnosis not present

## 2017-08-12 DIAGNOSIS — I2510 Atherosclerotic heart disease of native coronary artery without angina pectoris: Secondary | ICD-10-CM | POA: Diagnosis not present

## 2017-09-05 DIAGNOSIS — I2510 Atherosclerotic heart disease of native coronary artery without angina pectoris: Secondary | ICD-10-CM | POA: Diagnosis not present

## 2017-09-05 DIAGNOSIS — I350 Nonrheumatic aortic (valve) stenosis: Secondary | ICD-10-CM | POA: Diagnosis not present

## 2017-09-05 DIAGNOSIS — E781 Pure hyperglyceridemia: Secondary | ICD-10-CM | POA: Diagnosis not present

## 2017-09-05 DIAGNOSIS — G4733 Obstructive sleep apnea (adult) (pediatric): Secondary | ICD-10-CM | POA: Diagnosis not present

## 2017-09-05 DIAGNOSIS — I10 Essential (primary) hypertension: Secondary | ICD-10-CM | POA: Diagnosis not present

## 2017-11-15 DIAGNOSIS — X32XXXA Exposure to sunlight, initial encounter: Secondary | ICD-10-CM | POA: Diagnosis not present

## 2017-11-15 DIAGNOSIS — L570 Actinic keratosis: Secondary | ICD-10-CM | POA: Diagnosis not present

## 2017-11-15 DIAGNOSIS — B372 Candidiasis of skin and nail: Secondary | ICD-10-CM | POA: Diagnosis not present

## 2017-12-09 DIAGNOSIS — Z1211 Encounter for screening for malignant neoplasm of colon: Secondary | ICD-10-CM | POA: Diagnosis not present

## 2017-12-09 DIAGNOSIS — K219 Gastro-esophageal reflux disease without esophagitis: Secondary | ICD-10-CM | POA: Diagnosis not present

## 2018-04-02 DIAGNOSIS — I350 Nonrheumatic aortic (valve) stenosis: Secondary | ICD-10-CM | POA: Diagnosis not present

## 2018-04-02 DIAGNOSIS — Z01818 Encounter for other preprocedural examination: Secondary | ICD-10-CM | POA: Diagnosis not present

## 2018-04-02 DIAGNOSIS — I10 Essential (primary) hypertension: Secondary | ICD-10-CM | POA: Diagnosis not present

## 2018-04-02 DIAGNOSIS — E781 Pure hyperglyceridemia: Secondary | ICD-10-CM | POA: Diagnosis not present

## 2018-04-02 DIAGNOSIS — G4733 Obstructive sleep apnea (adult) (pediatric): Secondary | ICD-10-CM | POA: Diagnosis not present

## 2018-04-02 DIAGNOSIS — R0609 Other forms of dyspnea: Secondary | ICD-10-CM | POA: Diagnosis not present

## 2018-04-02 DIAGNOSIS — I2510 Atherosclerotic heart disease of native coronary artery without angina pectoris: Secondary | ICD-10-CM | POA: Diagnosis not present

## 2018-04-03 DIAGNOSIS — R0602 Shortness of breath: Secondary | ICD-10-CM | POA: Diagnosis not present

## 2018-04-15 ENCOUNTER — Encounter: Payer: Self-pay | Admitting: *Deleted

## 2018-04-16 ENCOUNTER — Ambulatory Visit
Admission: RE | Admit: 2018-04-16 | Discharge: 2018-04-16 | Disposition: A | Discharge status: Home / Self Care | Payer: Medicare Other | Source: Ambulatory Visit | Attending: Unknown Physician Specialty | Admitting: Unknown Physician Specialty

## 2018-04-16 ENCOUNTER — Encounter: Payer: Self-pay | Admitting: *Deleted

## 2018-04-16 ENCOUNTER — Ambulatory Visit: Payer: Medicare Other | Admitting: Anesthesiology

## 2018-04-16 ENCOUNTER — Encounter
Admission: RE | Disposition: A | Discharge status: Home / Self Care | Payer: Self-pay | Source: Ambulatory Visit | Attending: Unknown Physician Specialty

## 2018-04-16 DIAGNOSIS — Z955 Presence of coronary angioplasty implant and graft: Secondary | ICD-10-CM | POA: Insufficient documentation

## 2018-04-16 DIAGNOSIS — Z79899 Other long term (current) drug therapy: Secondary | ICD-10-CM | POA: Diagnosis not present

## 2018-04-16 DIAGNOSIS — I10 Essential (primary) hypertension: Secondary | ICD-10-CM | POA: Insufficient documentation

## 2018-04-16 DIAGNOSIS — Z1211 Encounter for screening for malignant neoplasm of colon: Secondary | ICD-10-CM | POA: Insufficient documentation

## 2018-04-16 DIAGNOSIS — K640 First degree hemorrhoids: Secondary | ICD-10-CM | POA: Diagnosis not present

## 2018-04-16 DIAGNOSIS — I2510 Atherosclerotic heart disease of native coronary artery without angina pectoris: Secondary | ICD-10-CM | POA: Insufficient documentation

## 2018-04-16 DIAGNOSIS — G4730 Sleep apnea, unspecified: Secondary | ICD-10-CM | POA: Insufficient documentation

## 2018-04-16 DIAGNOSIS — E785 Hyperlipidemia, unspecified: Secondary | ICD-10-CM | POA: Diagnosis not present

## 2018-04-16 DIAGNOSIS — K5730 Diverticulosis of large intestine without perforation or abscess without bleeding: Secondary | ICD-10-CM | POA: Insufficient documentation

## 2018-04-16 DIAGNOSIS — I252 Old myocardial infarction: Secondary | ICD-10-CM | POA: Diagnosis not present

## 2018-04-16 DIAGNOSIS — K5790 Diverticulosis of intestine, part unspecified, without perforation or abscess without bleeding: Secondary | ICD-10-CM | POA: Diagnosis not present

## 2018-04-16 DIAGNOSIS — K648 Other hemorrhoids: Secondary | ICD-10-CM | POA: Diagnosis not present

## 2018-04-16 DIAGNOSIS — Z9989 Dependence on other enabling machines and devices: Secondary | ICD-10-CM | POA: Diagnosis not present

## 2018-04-16 DIAGNOSIS — Z7982 Long term (current) use of aspirin: Secondary | ICD-10-CM | POA: Diagnosis not present

## 2018-04-16 DIAGNOSIS — Z952 Presence of prosthetic heart valve: Secondary | ICD-10-CM | POA: Insufficient documentation

## 2018-04-16 DIAGNOSIS — K635 Polyp of colon: Secondary | ICD-10-CM | POA: Diagnosis not present

## 2018-04-16 DIAGNOSIS — D123 Benign neoplasm of transverse colon: Secondary | ICD-10-CM | POA: Insufficient documentation

## 2018-04-16 DIAGNOSIS — Z87891 Personal history of nicotine dependence: Secondary | ICD-10-CM | POA: Insufficient documentation

## 2018-04-16 DIAGNOSIS — K219 Gastro-esophageal reflux disease without esophagitis: Secondary | ICD-10-CM | POA: Diagnosis not present

## 2018-04-16 HISTORY — PX: COLONOSCOPY WITH PROPOFOL: SHX5780

## 2018-04-16 HISTORY — DX: Pneumonia, unspecified organism: J18.9

## 2018-04-16 HISTORY — DX: Cardiac arrhythmia, unspecified: I49.9

## 2018-04-16 HISTORY — DX: Atherosclerotic heart disease of native coronary artery without angina pectoris: I25.10

## 2018-04-16 HISTORY — DX: Elevated prostate specific antigen (PSA): R97.20

## 2018-04-16 SURGERY — COLONOSCOPY WITH PROPOFOL
Anesthesia: General

## 2018-04-16 MED ORDER — PROPOFOL 10 MG/ML IV BOLUS
INTRAVENOUS | Status: DC | PRN
  Administered 2018-04-16: 100 mg via INTRAVENOUS

## 2018-04-16 MED ORDER — SODIUM CHLORIDE 0.9 % IJ SOLN
INTRAMUSCULAR | Status: AC
  Filled 2018-04-16: qty 10

## 2018-04-16 MED ORDER — LIDOCAINE HCL (PF) 2 % IJ SOLN
INTRAMUSCULAR | Status: AC
  Filled 2018-04-16: qty 10

## 2018-04-16 MED ORDER — FENTANYL CITRATE (PF) 100 MCG/2ML IJ SOLN
INTRAMUSCULAR | Status: AC
  Filled 2018-04-16: qty 2

## 2018-04-16 MED ORDER — FENTANYL CITRATE (PF) 100 MCG/2ML IJ SOLN
INTRAMUSCULAR | Status: DC | PRN
  Administered 2018-04-16 (×2): 50 ug via INTRAVENOUS

## 2018-04-16 MED ORDER — PIPERACILLIN-TAZOBACTAM 3.375 G IVPB
INTRAVENOUS | Status: DC
Start: 2018-04-16 — End: 2018-04-16
  Filled 2018-04-16: qty 50

## 2018-04-16 MED ORDER — PHENYLEPHRINE HCL 10 MG/ML IJ SOLN
INTRAMUSCULAR | Status: AC
  Filled 2018-04-16: qty 1

## 2018-04-16 MED ORDER — SODIUM CHLORIDE 0.9 % IV SOLN
INTRAVENOUS | Status: DC
  Administered 2018-04-16 (×2): via INTRAVENOUS

## 2018-04-16 MED ORDER — SODIUM CHLORIDE 0.9 % IV SOLN: INTRAVENOUS | Status: DC

## 2018-04-16 MED ORDER — PROPOFOL 10 MG/ML IV BOLUS
INTRAVENOUS | Status: AC
  Filled 2018-04-16: qty 20

## 2018-04-16 MED ORDER — GLYCOPYRROLATE 0.2 MG/ML IJ SOLN
INTRAMUSCULAR | Status: AC
  Filled 2018-04-16: qty 1

## 2018-04-16 MED ORDER — EPHEDRINE SULFATE 50 MG/ML IJ SOLN
INTRAMUSCULAR | Status: DC | PRN
  Administered 2018-04-16: 5 mg via INTRAVENOUS
  Administered 2018-04-16: 10 mg via INTRAVENOUS

## 2018-04-16 MED ORDER — PHENYLEPHRINE HCL 10 MG/ML IJ SOLN
INTRAMUSCULAR | Status: DC | PRN
  Administered 2018-04-16: 100 ug via INTRAVENOUS

## 2018-04-16 MED ORDER — PROPOFOL 500 MG/50ML IV EMUL
INTRAVENOUS | Status: DC | PRN
  Administered 2018-04-16: 160 ug/kg/min via INTRAVENOUS

## 2018-04-16 MED ORDER — LIDOCAINE 2% (20 MG/ML) 5 ML SYRINGE
INTRAMUSCULAR | Status: DC | PRN
  Administered 2018-04-16: 30 mg via INTRAVENOUS

## 2018-04-16 MED ORDER — PIPERACILLIN-TAZOBACTAM 3.375 G IVPB
3.3750 g | Freq: Once | INTRAVENOUS | Status: AC
  Administered 2018-04-16: 3.375 g via INTRAVENOUS

## 2018-04-16 MED ORDER — EPHEDRINE SULFATE 50 MG/ML IJ SOLN
INTRAMUSCULAR | Status: AC
  Filled 2018-04-16: qty 1

## 2018-04-16 MED ORDER — PROPOFOL 500 MG/50ML IV EMUL
INTRAVENOUS | Status: AC
  Filled 2018-04-16: qty 50

## 2018-04-16 NOTE — Anesthesia Post-op Follow-up Note (Signed)
Anesthesia QCDR form completed.        

## 2018-04-16 NOTE — Transfer of Care (Signed)
Immediate Anesthesia Transfer of Care Note  Patient: Adam Zavala.  Procedure(s) Performed: COLONOSCOPY WITH PROPOFOL (N/A )  Patient Location: PACU and Endoscopy Unit  Anesthesia Type:General  Level of Consciousness: sedated  Airway & Oxygen Therapy: Patient Spontanous Breathing and Patient connected to nasal cannula oxygen  Post-op Assessment: Report given to RN and Post -op Vital signs reviewed and stable  Post vital signs: Reviewed and stable  Last Vitals:  Vitals Value Taken Time  BP    Temp 36.1 C 04/16/2018  8:02 AM  Pulse 65 04/16/2018  8:04 AM  Resp 17 04/16/2018  8:04 AM  SpO2 98 % 04/16/2018  8:04 AM  Vitals shown include unvalidated device data.  Last Pain:  Vitals:   04/16/18 0802  TempSrc: Tympanic         Complications: No apparent anesthesia complications

## 2018-04-16 NOTE — Anesthesia Preprocedure Evaluation (Signed)
Anesthesia Evaluation  Patient identified by MRN, date of birth, ID band Patient awake    Reviewed: Allergy & Precautions, H&P , NPO status , Patient's Chart, lab work & pertinent test results  History of Anesthesia Complications Negative for: history of anesthetic complications  Airway Mallampati: III  TM Distance: <3 FB Neck ROM: limited    Dental  (+) Poor Dentition, Chipped, Caps   Pulmonary neg shortness of breath, sleep apnea and Continuous Positive Airway Pressure Ventilation , former smoker,    Pulmonary exam normal breath sounds clear to auscultation       Cardiovascular Exercise Tolerance: Good hypertension, (-) angina+ CAD, + Past MI and + Cardiac Stents  (-) DOE Normal cardiovascular exam+ Valvular Problems/Murmurs AS  Rhythm:regular Rate:Normal     Neuro/Psych negative neurological ROS  negative psych ROS   GI/Hepatic Neg liver ROS, GERD  Controlled and Medicated,  Endo/Other  negative endocrine ROS  Renal/GU      Musculoskeletal   Abdominal   Peds  Hematology negative hematology ROS (+)   Anesthesia Other Findings Patient has cardiac clearance for this procedure.   Past Medical History: No date: A-fib [facility])     Comment: now in normal sinus rhytm No date: Aortic stenosis 03/2013: Aortic valve replaced No date: GERD (gastroesophageal reflux disease) No date: Heart attack No date: Heart murmur No date: Hyperlipidemia No date: Hypertension No date: Sleep apnea  Past Surgical History: No date: achilles tendon rupture repair No date: CHOLECYSTECTOMY No date: COLON SURGERY No date: COLONOSCOPY W/ BIOPSIES AND POLYPECTOMY     Comment: 3 polyps removed No date: partial removal of colon No date: PROSTATE BIOPSY No date: right shoulder surgery     Comment: separation/ dislocated No date: ROTATOR CUFF REPAIR No date: SPHINCTEROTOMY  BMI    Body Mass Index:  33.97 kg/m      Reproductive/Obstetrics negative OB ROS                             Anesthesia Physical  Anesthesia Plan  ASA: III  Anesthesia Plan: General   Post-op Pain Management:    Induction:   PONV Risk Score and Plan:   Airway Management Planned: Nasal Cannula  Additional Equipment:   Intra-op Plan:   Post-operative Plan:   Informed Consent: I have reviewed the patients History and Physical, chart, labs and discussed the procedure including the risks, benefits and alternatives for the proposed anesthesia with the patient or authorized representative who has indicated his/her understanding and acceptance.   Dental Advisory Given  Plan Discussed with: Anesthesiologist, CRNA and Surgeon  Anesthesia Plan Comments:         Anesthesia Quick Evaluation

## 2018-04-16 NOTE — Anesthesia Postprocedure Evaluation (Signed)
Anesthesia Post Note  Patient: Adam Zavala.  Procedure(s) Performed: COLONOSCOPY WITH PROPOFOL (N/A )  Patient location during evaluation: Endoscopy Anesthesia Type: General Level of consciousness: awake and alert and oriented Pain management: pain level controlled Vital Signs Assessment: post-procedure vital signs reviewed and stable Respiratory status: spontaneous breathing Cardiovascular status: blood pressure returned to baseline Anesthetic complications: no     Last Vitals:  Vitals:   04/16/18 0825 04/16/18 0852  BP: 104/67 120/72  Pulse:  (!) 55  Resp:  20  Temp:    SpO2:  98%    Last Pain:  Vitals:   04/16/18 0852  TempSrc:   PainSc: 0-No pain                 Indigo Barbian

## 2018-04-16 NOTE — Op Note (Signed)
[facility] Patient Name: Adam Zavala Procedure Date: 04/16/2018 7:33 AM MRN: [id number] Account #: [id number] Date of Birth: [birth year] Admit Type: Outpatient Age: 72 Room: [facility] ENDO ROOM 3 Gender: Male Note Status: Finalized Procedure:            Colonoscopy Indications:          Screening for colorectal malignant neoplasm Providers:            [name], MD Referring MD:         Ramonita Lab, MD (Referring MD) Medicines:            Propofol per Anesthesia Complications:        No immediate complications. Procedure:            Pre-Anesthesia Assessment:                       - After reviewing the risks and benefits, the patient                        was deemed in satisfactory condition to undergo the                        procedure.                       After obtaining informed consent, the colonoscope was                        passed under direct vision. Throughout the procedure,                        the patient's blood pressure, pulse, and oxygen                        saturations were monitored continuously. The                        Colonoscope was introduced through the anus and                        advanced to the the cecum, identified by appendiceal                        orifice and ileocecal valve. The colonoscopy was                        performed without difficulty. The patient tolerated the                        procedure well. The quality of the bowel preparation                        was adequate to identify polyps. Findings:      A small polyp was found in the proximal transverse colon. The polyp was       sessile. The polyp was removed with a jumbo cold forceps. Resection and       retrieval were complete. To prevent bleeding after the polypectomy, one       hemostatic clip was successfully placed. There was no bleeding at the       end of the procedure.  Multiple small-mouthed diverticula were found in the  sigmoid colon.      Internal hemorrhoids were found during endoscopy. The hemorrhoids were       small and Grade I (internal hemorrhoids that do not prolapse). Impression:           - One small polyp in the proximal transverse colon,                        removed with a jumbo cold forceps. Resected and                        retrieved. Clip was placed.                       - Diverticulosis in the sigmoid colon.                       - Internal hemorrhoids. Recommendation:       - Await pathology results. [name], MD 04/16/2018 8:01:30 AM This report has been signed electronically. Number of Addenda: 0 Note Initiated On: 04/16/2018 7:33 AM Scope Withdrawal Time: 0 hours 9 minutes 5 seconds  Total Procedure Duration: 0 hours 17 minutes 9 seconds       [facility]

## 2018-04-16 NOTE — H&P (Signed)
Primary Care Physician:  Adin Hector, MD Primary Gastroenterologist:  Dr. [name]  Pre-Procedure History & Physical: HPI:  Adam Steenson. is a 72 y.o. male is here for an colonoscopy.  Done for colon cancer screening.   Past Medical History:  Diagnosis Date  . A-fib (HCC)    now in normal sinus rhytm  . Aortic stenosis   . Aortic valve replaced 03/2013  . Coronary artery disease    one vessel coronary artery disease, status post DES stent placement at time of aortic valve replacement April 2014. Followed by Dr. Serafina Royals  . Dysrhythmia   . Elevated PSA   . GERD (gastroesophageal reflux disease)    with Barretts esophagus followed by Dr. [name]  . Heart attack ([place])   . Heart murmur   . Hyperlipidemia   . Hypertension   . Pneumonia   . Sleep apnea     Past Surgical History:  Procedure Laterality Date  . achilles tendon rupture repair    . AORTIC VALVE SURGERY    . CARDIAC VALVE REPLACEMENT    . CHOLECYSTECTOMY    . COLON SURGERY    . COLONOSCOPY W/ BIOPSIES AND POLYPECTOMY     3 polyps removed  . EPIDIDYMECTOMY Right 11/12/2016   Procedure: PARTIAL EPIDIDYMECTOMY;  Surgeon: [name], MD;  Location: [facility] ORS;  Service: Urology;  Laterality: Right;  . partial removal of colon    . PROSTATE BIOPSY    . right shoulder surgery     separation/ dislocated  . ROTATOR CUFF REPAIR    . S/p rectotomy for fissures    . SPHINCTEROTOMY    . VASECTOMY Right 11/12/2016   Procedure: VASECTOMY;  Surgeon: [name], MD;  Location: [facility] OR[facility];  Service: Urology;  Laterality: Right;    Prior to Admission medications   Medication Sig Start Date End Date Taking? Authorizing Provider  amLODipine (NORVASC) 10 MG tablet Take 1 tablet (10 mg total) by mouth daily. 08/10/17 08/10/18 Yes Hinda Kehr, MD  aspirin 81 MG tablet Take 81 mg by mouth daily.   Yes [provider]  atorvastatin (LIPITOR) 80 MG tablet Take 80 mg by mouth every evening.  12/21/15  Yes  [provider]  esomeprazole (NEXIUM) 40 MG capsule Take 40 mg by mouth daily before breakfast.   Yes [provider]  lisinopril (PRINIVIL,ZESTRIL) 10 MG tablet Take 1 tablet (10 mg total) by mouth 2 (two) times daily. 08/10/17  Yes Hinda Kehr, MD  Tamsulosin HCl (FLOMAX) 0.4 MG CAPS Take 0.4 mg by mouth daily.   Yes [provider]  docusate sodium (COLACE) 100 MG capsule Take 1 capsule (100 mg total) by mouth 2 (two) times daily. 11/12/16   [name], MD  HYDROcodone-acetaminophen (NORCO/VICODIN) 5-325 MG tablet Take 1-2 tablets by mouth every 6 (six) hours as needed for moderate pain. Patient not taking: Reported on 04/16/2018 11/12/16   [name], MD  ibuprofen (ADVIL,MOTRIN) 200 MG tablet Take 400 mg by mouth every 6 (six) hours as needed for moderate pain.    [provider]  Omega-3 Fatty Acids (FISH OIL PO) Take 1 capsule by mouth once a week.    [provider]    Allergies as of 01/06/2018  . (No Known Allergies)    Family History  Problem Relation Age of Onset  . Bladder Cancer Neg Hx   . Prostate cancer Neg Hx   . Kidney cancer Neg Hx     Social  History   Socioeconomic History  . Marital status: Married    Spouse name: Not on file  . Number of children: Not on file  . Years of education: Not on file  . Highest education level: Not on file  Occupational History  . Not on file  Social Needs  . Financial resource strain: Not on file  . Food insecurity:    Worry: Not on file    Inability: Not on file  . Transportation needs:    Medical: Not on file    Non-medical: Not on file  Tobacco Use  . Smoking status: Former Smoker    Types: Cigars  . Smokeless tobacco: Never Used  Substance and Sexual Activity  . Alcohol use: Yes    Alcohol/week: 0.0 oz    Comment: occasional   . Drug use: No  . Sexual activity: Not on file  Lifestyle  . Physical activity:    Days per week: Not on file    Minutes per  session: Not on file  . Stress: Not on file  Relationships  . Social connections:    Talks on phone: Not on file    Gets together: Not on file    Attends religious service: Not on file    Active member of club or organization: Not on file    Attends meetings of clubs or organizations: Not on file    Relationship status: Not on file  . Intimate partner violence:    Fear of current or ex partner: Not on file    Emotionally abused: Not on file    Physically abused: Not on file    Forced sexual activity: Not on file  Other Topics Concern  . Not on file  Social History Narrative  . Not on file    Review of Systems: See HPI, otherwise negative ROS  Physical Exam: BP (!) 127/93   Pulse 66   Temp (!) 96.5 F (35.8 C) (Tympanic)   Resp 18   Ht 6\' 2"  (1.88 m)   Wt 117.9 kg (260 lb)   SpO2 99%   BMI 33.38 kg/m  General:   Alert,  pleasant and cooperative in NAD Head:  Normocephalic and atraumatic. Neck:  Supple; no masses or thyromegaly. Lungs:  Clear throughout to auscultation.    Heart:  Regular rate and rhythm. Abdomen:  Soft, nontender and nondistended. Normal bowel sounds, without guarding, and without rebound.   Neurologic:  Alert and  oriented x4;  grossly normal neurologically.  Impression/Plan: Adam Pap. is here for an colonoscopy to be performed for colon cancer screening.  Risks, benefits, limitations, and alternatives regarding  colonoscopy have been reviewed with the patient.  Questions have been answered.  All parties agreeable.   Gaylyn Cheers, MD  04/16/2018, 7:33 AM

## 2018-04-17 LAB — SURGICAL PATHOLOGY

## 2019-05-05 ENCOUNTER — Other Ambulatory Visit: Payer: Self-pay

## 2019-05-05 ENCOUNTER — Telehealth (INDEPENDENT_AMBULATORY_CARE_PROVIDER_SITE_OTHER): Payer: Medicare Other | Admitting: Urology

## 2019-05-05 DIAGNOSIS — N5089 Other specified disorders of the male genital organs: Secondary | ICD-10-CM

## 2019-05-11 NOTE — Progress Notes (Signed)
Virtual Visit via Video Note  I connected with Adam Zavala. on 05/06/2019 at  4:00 PM EDT by a video enabled telemedicine application and verified that I am speaking with the correct person using two identifiers.  Location: Patient: home Provider: work   I discussed the limitations of evaluation and management by telemedicine and the availability of in person appointments. The patient expressed understanding and agreed to proceed.  History of Present Illness: 73 year old male who presents today via virtual visit to discuss left sided scrotal mass.  Adam Zavala has a personal history of a right extra testicular mass and underwent right scrotal exploration, vasectomy and partial epididymectomy were portion of his right-sided convoluted vas was excised.  Surgical pathology was consistent with the vasitis nodosum with calcified material and no evidence of malignancy.  His clinical course had been preceded by an episode of testicular discomfort with an enlarging mass on his initial presentation.  He reports today that over the past 8 months, he has developed enlargement in his left hemiscrotum just below his testicle very similar with his previous physical exam.  This time, he denies any pain or discomfort associated with this.  He reports that it feels like a string of beads adjacent to but not involving his left testicle.  He denies any scrotal swelling, pain, redness, or discomfort.  He does have a personal history of vasectomy.   Observations/Objective: Appears clinically well  Assessment and Plan:  1. Scrotal mass Based on the patient's history as well as self exam findings, this is most likely consistent with the same process that he previously occurred on the right, vasitis nodosum  We discussed that this is a benign process and likely will not require surgical excision as previous  Currently Adam Zavala is [place] the [place] in [place].  Given that this process  appears to be slow-moving, is nonpainful and he is a personal history of the similar benign process on the contralateral side, he will follow-up nonurgently when he is back in town for physical exam to confirm as per our discussion.  He was also offered a scrotal ultrasound which he declined and will likely not be necessary if exam findings are consistent with his clinical history.  Return sooner if he develops any pain or significant enlargement in the interim.   Follow Up Instructions:  Adam Zavala to call when he is in town to schedule in person visit   I discussed the assessment and treatment plan with the patient. The patient was provided an opportunity to ask questions and all were answered. The patient agreed with the plan and demonstrated an understanding of the instructions.   The patient was advised to call back or seek an in-person evaluation if the symptoms worsen or if the condition fails to improve as anticipated.  I provided 15 minutes of non-face-to-face time during this encounter.   [name], MD

## 2019-11-17 ENCOUNTER — Encounter: Payer: Self-pay | Admitting: Urology

## 2019-11-17 ENCOUNTER — Telehealth: Payer: Self-pay | Admitting: Urology

## 2019-11-17 ENCOUNTER — Other Ambulatory Visit: Payer: Self-pay

## 2019-11-17 ENCOUNTER — Ambulatory Visit
Admission: RE | Admit: 2019-11-17 | Discharge: 2019-11-17 | Disposition: A | Discharge status: Home / Self Care | Payer: Medicare Other | Source: Ambulatory Visit | Attending: Urology | Admitting: Urology

## 2019-11-17 ENCOUNTER — Ambulatory Visit (INDEPENDENT_AMBULATORY_CARE_PROVIDER_SITE_OTHER): Payer: Medicare Other | Admitting: Urology

## 2019-11-17 VITALS — BP 130/84 | HR 81 | Ht 74.0 in | Wt 256.1 lb

## 2019-11-17 DIAGNOSIS — N23 Unspecified renal colic: Secondary | ICD-10-CM

## 2019-11-17 DIAGNOSIS — R1032 Left lower quadrant pain: Secondary | ICD-10-CM | POA: Diagnosis present

## 2019-11-17 LAB — URINALYSIS, COMPLETE
Bilirubin, UA: NEGATIVE
Glucose, UA: NEGATIVE
Ketones, UA: NEGATIVE
Leukocytes,UA: NEGATIVE
Nitrite, UA: NEGATIVE
Protein,UA: NEGATIVE
RBC, UA: NEGATIVE
Specific Gravity, UA: 1.015 (ref 1.005–1.030)
Urobilinogen, Ur: 0.2 mg/dL (ref 0.2–1.0)
pH, UA: 7 (ref 5.0–7.5)

## 2019-11-17 LAB — MICROSCOPIC EXAMINATION: Bacteria, UA: NONE SEEN

## 2019-11-17 MED ORDER — OXYCODONE-ACETAMINOPHEN 5-325 MG PO TABS: 1.0000 | ORAL_TABLET | Freq: Four times a day (QID) | ORAL | 0 refills | Status: DC | PRN

## 2019-11-17 NOTE — Telephone Encounter (Signed)
I contacted Adam Zavala regarding his CT results.  The kidneys were normal in appearance without mass, hydronephrosis or calculi.  No ureteral calculi were identified.  I do not see a urologic source of his pain.  I did recommend he contact Dr. Olin Pia office to evaluate other potential sources of his pain including musculoskeletal/GI.

## 2019-11-17 NOTE — Progress Notes (Signed)
11/17/2019 3:58 PM   Adam Zavala. Apr 08, 1946 [id number]  Referring provider: Adin Hector, MD [facility] [place],  Adam Zavala [address]  Chief Complaint  Patient presents with  . Other    HPI: 73 y.o. male with a history of a right extra testicular mass status post right scrotal exploration, vasectomy and partial epididymectomy by Dr. Erlene Quan in 2019 with pathology showing the vasitis nodosum.  [place]12 had a twinge of left flank pain which lasted briefly then resolved.  The following day he had 2 episodes of mild left flank pain which were not bothersome.  He got back to [facility] yesterday and last night had onset of constant left lower quadrant pain with some radiation to the left hemiscrotum.  Last night the severity was rated 8-9.  His pain seems to be better when standing and worse when sitting.  Denies nausea, vomiting, fever or chills.  He did note some increased nocturia last night.  He is on tamsulosin for BPH.  Denies previous history of stone disease.  Denies gross hematuria.   PMH: Past Medical History:  Diagnosis Date  . A-fib (HCC)    now in normal sinus rhytm  . Aortic stenosis   . Aortic valve replaced 03/2013  . Coronary artery disease    one vessel coronary artery disease, status post DES stent placement at time of aortic valve replacement April 2014. Followed by Dr. Serafina Royals  . Dysrhythmia   . Elevated PSA   . GERD (gastroesophageal reflux disease)    with Barretts esophagus followed by Dr. [name]  . Heart attack ([place])   . Heart murmur   . Hyperlipidemia   . Hypertension   . Pneumonia   . Sleep apnea     Surgical History: Past Surgical History:  Procedure Laterality Date  . achilles tendon rupture repair    . AORTIC VALVE SURGERY    . CARDIAC VALVE REPLACEMENT    . CHOLECYSTECTOMY    . COLON SURGERY    . COLONOSCOPY W/ BIOPSIES AND POLYPECTOMY     3  polyps removed  . COLONOSCOPY WITH PROPOFOL N/A 04/16/2018   Procedure: COLONOSCOPY WITH PROPOFOL;  Surgeon: [name], MD;  Location: [facility];  Service: Endoscopy;  Laterality: N/A;  . EPIDIDYMECTOMY Right 11/12/2016   Procedure: PARTIAL EPIDIDYMECTOMY;  Surgeon: [name], MD;  Location: [facility] OR[facility];  Service: Urology;  Laterality: Right;  . partial removal of colon    . PROSTATE BIOPSY    . right shoulder surgery     separation/ dislocated  . ROTATOR CUFF REPAIR    . S/p rectotomy for fissures    . SPHINCTEROTOMY    . VASECTOMY Right 11/12/2016   Procedure: VASECTOMY;  Surgeon: [name], MD;  Location: [facility] OR[facility];  Service: Urology;  Laterality: Right;    Home Medications:  Allergies as of 11/17/2019   No Known Allergies     Medication List       Accurate as of November 17, 2019  3:58 PM. If you have any questions, ask your nurse or doctor.        amLODipine 10 MG tablet Commonly known as: NORVASC Take 1 tablet (10 mg total) by mouth daily.   aspirin 81 MG tablet Take 81 mg by mouth daily.   atorvastatin 80 MG tablet Commonly known as: LIPITOR Take 80 mg by mouth every evening.  Bystolic 2.5 MG tablet Generic drug: nebivolol Take 2.5 mg by mouth daily.   docusate sodium 100 MG capsule Commonly known as: COLACE Take 1 capsule (100 mg total) by mouth 2 (two) times daily.   esomeprazole 40 MG capsule Commonly known as: NEXIUM Take 40 mg by mouth daily before breakfast.   FISH OIL PO Take 1 capsule by mouth once a week.   HYDROcodone-acetaminophen 5-325 MG tablet Commonly known as: NORCO/VICODIN Take 1-2 tablets by mouth every 6 (six) hours as needed for moderate pain.   ibuprofen 200 MG tablet Commonly known as: ADVIL Take 400 mg by mouth every 6 (six) hours as needed for moderate pain.   lisinopril 10 MG tablet Commonly known as: ZESTRIL Take 1 tablet (10 mg total) by mouth 2 (two) times daily.   tamsulosin 0.4 MG Caps  capsule Commonly known as: FLOMAX Take 0.4 mg by mouth daily.       Allergies: No Known Allergies  Family History: Family History  Problem Relation Age of Onset  . Bladder Cancer Neg Hx   . Prostate cancer Neg Hx   . Kidney cancer Neg Hx     Social History:  reports that he has quit smoking. His smoking use included cigars. He has never used smokeless tobacco. He reports current alcohol use. He reports that he does not use drugs.  ROS: UROLOGY Frequent Urination?: No Hard to postpone urination?: No Burning/pain with urination?: No Get up at night to urinate?: Yes Leakage of urine?: No Urine stream starts and stops?: No Trouble starting stream?: No Do you have to strain to urinate?: No Blood in urine?: No Urinary tract infection?: No Sexually transmitted disease?: No Injury to kidneys or bladder?: No Painful intercourse?: No Weak stream?: No Erection problems?: No Penile pain?: No  Gastrointestinal Nausea?: No Vomiting?: No Indigestion/heartburn?: No Diarrhea?: No Constipation?: No  Constitutional Fever: No Night sweats?: No Weight loss?: No Fatigue?: No  Skin Skin rash/lesions?: No Itching?: No  Eyes Blurred vision?: No Double vision?: No  Ears/Nose/Throat Sore throat?: No Sinus problems?: No  Hematologic/Lymphatic Swollen glands?: No Easy bruising?: No  Cardiovascular Leg swelling?: No Chest pain?: No  Respiratory Cough?: No Shortness of breath?: No  Endocrine Excessive thirst?: No  Musculoskeletal Back pain?: Yes Joint pain?: No  Neurological Headaches?: No Dizziness?: No  Psychologic Depression?: No Anxiety?: No  Physical Exam: BP 130/84   Pulse 81   Ht 6\' 2"  (1.88 m)   Wt 256 lb 1.6 oz (116.2 kg)   BMI 32.88 kg/m   Constitutional:  Alert and oriented, No acute distress. HEENT:  AT, moist mucus membranes.  Trachea midline, no masses. Cardiovascular: No clubbing, cyanosis, or edema. Respiratory: Normal respiratory  effort, no increased work of breathing. GI: Abdomen is soft, nontender, nondistended, no abdominal masses GU: No CVA tenderness.  Phallus without lesions testes descended bilaterally without masses or tenderness. Skin: No rashes, bruises or suspicious lesions. Neurologic: Grossly intact, no focal deficits, moving all 4 extremities. Psychiatric: Normal mood and affect.  Laboratory Data:  Urinalysis Dipstick/microscopy negative  Assessment & Plan:   73 y.o. male with left flank/left lower quadrant abdominal pain.  Urinalysis does not show microhematuria however clinical presentation could be secondary to a ureteral calculus.  I recommended scheduling a noncontrast CT of the abdomen pelvis for further evaluation.  I recommended this be done today in the event he has a calculus that could be treated with lithotripsy later this week.  He was given a limited Rx oxycodone  until the CT is performed.  If the CT is negative would recommend PCP follow-up to evaluate for a musculoskeletal source of his pain.   Abbie Sons, Jeannette [address], [place], [place] [address] [phone]

## 2019-12-29 ENCOUNTER — Ambulatory Visit: Payer: Medicare Other

## 2020-01-07 ENCOUNTER — Ambulatory Visit: Payer: Medicare Other

## 2020-01-15 ENCOUNTER — Ambulatory Visit: Payer: Medicare Other

## 2020-04-08 IMAGING — CT CT RENAL STONE PROTOCOL
2 of 4 series · 16 of 46 positions shown, 18 images · non-contrast
Comparison: 12/12/2015

CLINICAL DATA: Flank pain kidney stones suspected

EXAM:
CT ABDOMEN AND PELVIS WITHOUT CONTRAST
TECHNIQUE: Multidetector CT imaging of the abdomen and pelvis was performed
following the standard protocol without IV contrast.

[Series 2: stone full standard · axial · 0.87mm/px · z∈[-485,-25]mm · 13 of 102 slices shown, 15 images]
[im 5/102  soft-tissue]
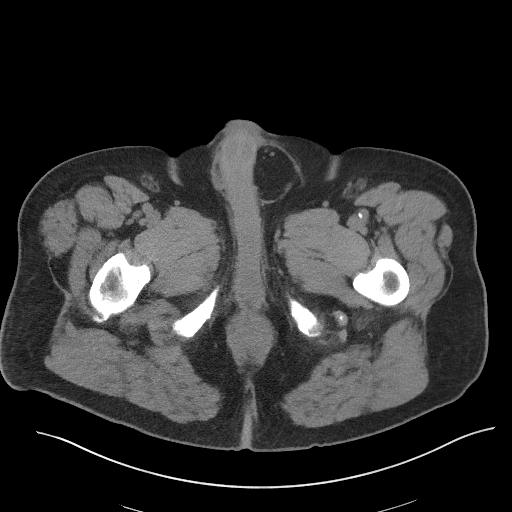
[im 5/102  bone]
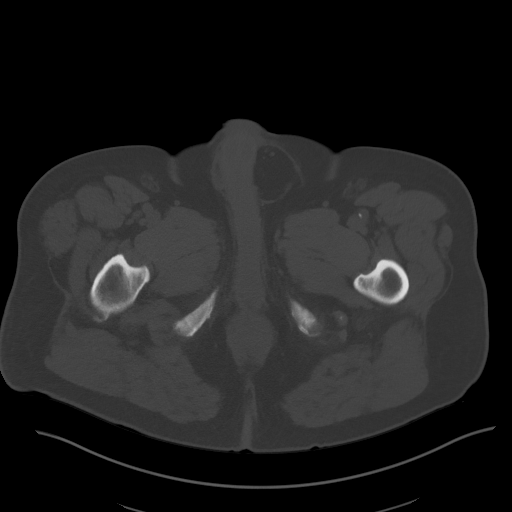
[im 13/102  soft-tissue]
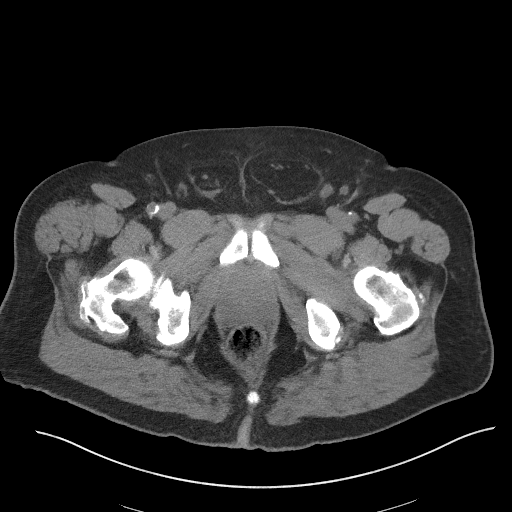
[im 22/102  soft-tissue]
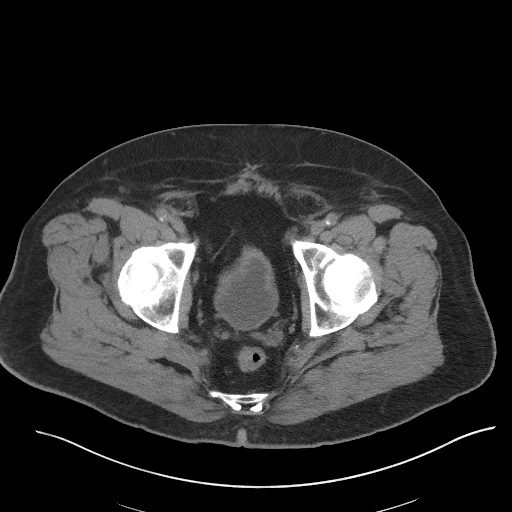
[im 30/102  soft-tissue]
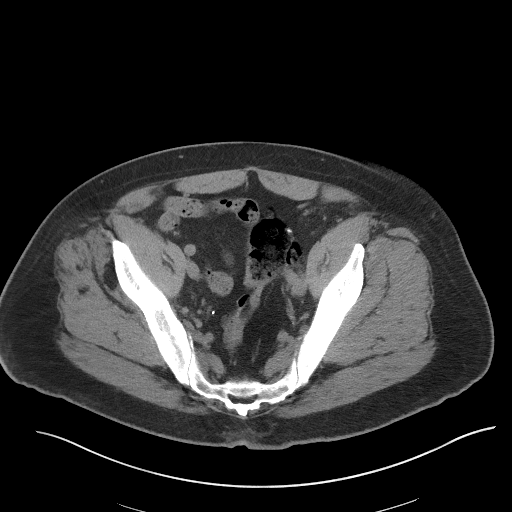
[im 34/102  soft-tissue]
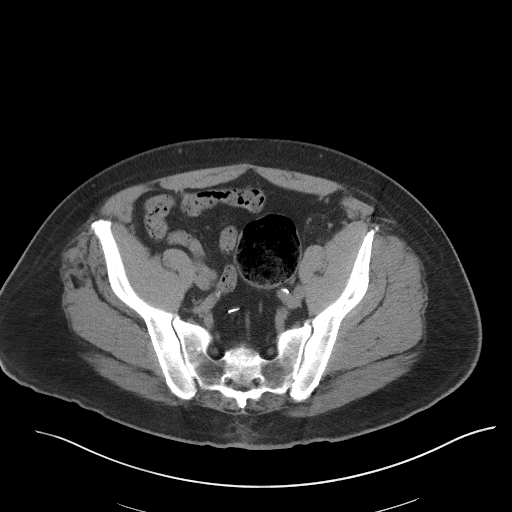
[im 43/102  soft-tissue]
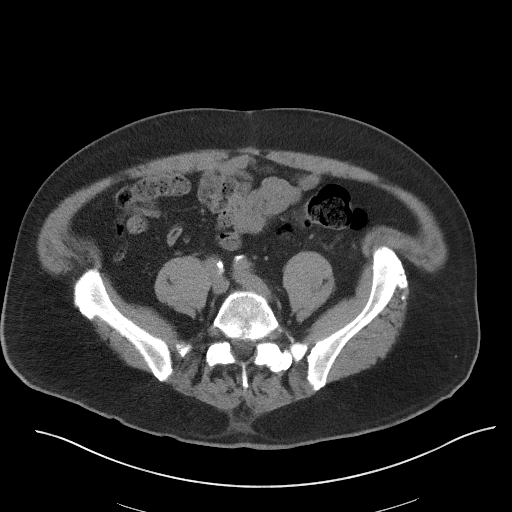
[im 51/102  soft-tissue]
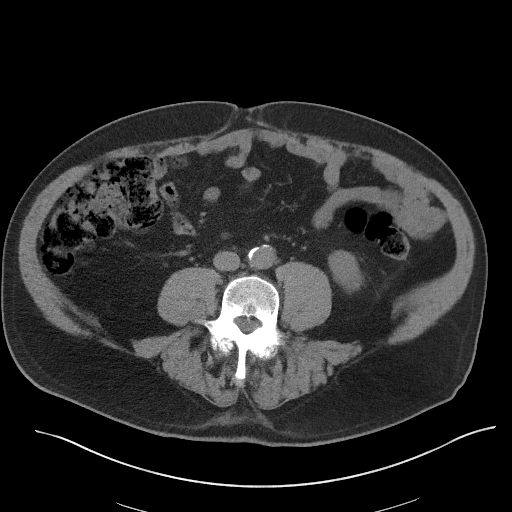
[im 59/102  soft-tissue]
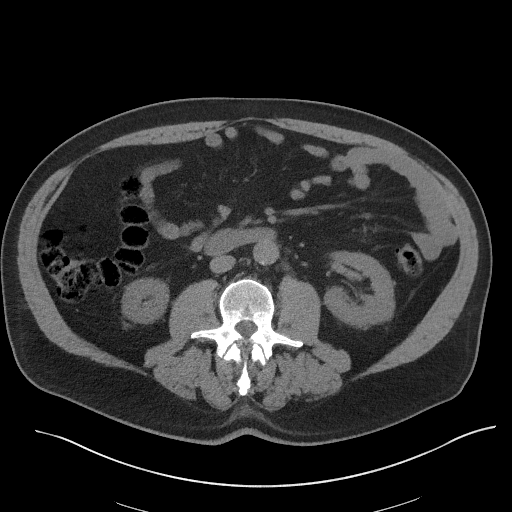
[im 68/102  soft-tissue]
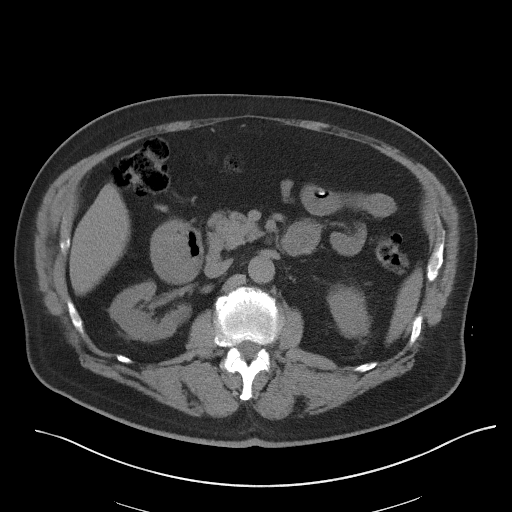
[im 68/102  bone]
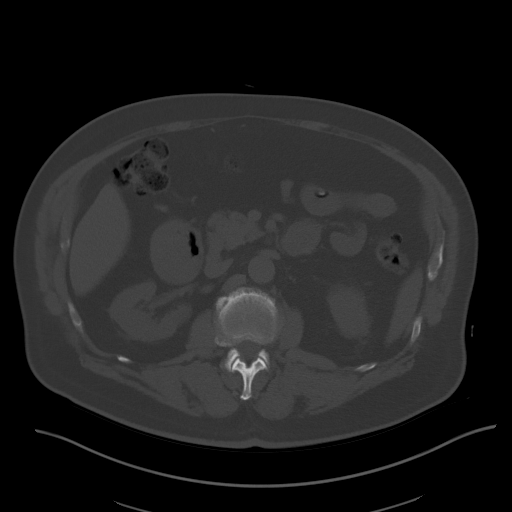
[im 72/102  soft-tissue]
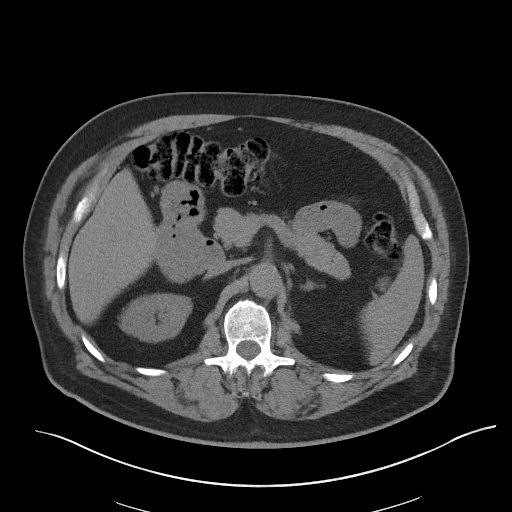
[im 80/102  soft-tissue]
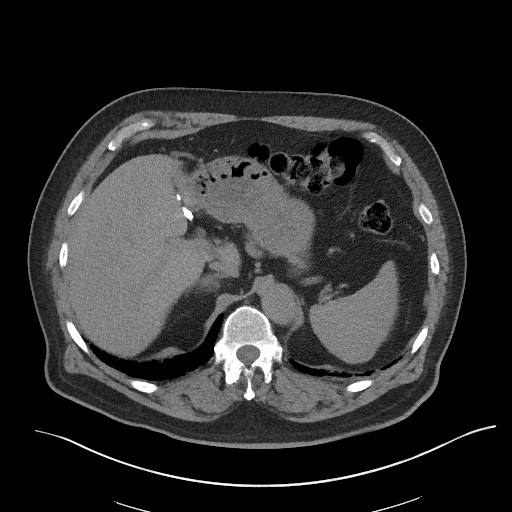
[im 89/102  soft-tissue]
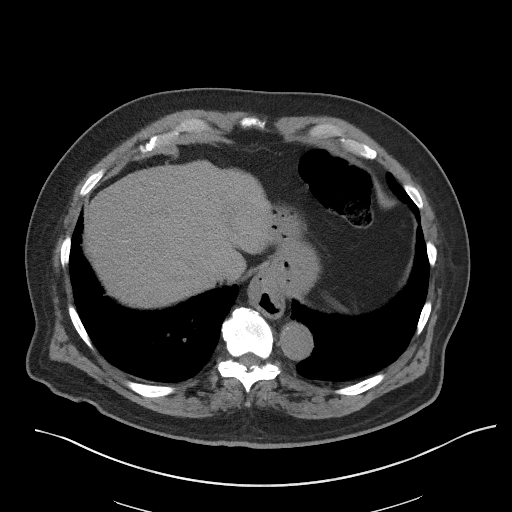
[im 97/102  soft-tissue]
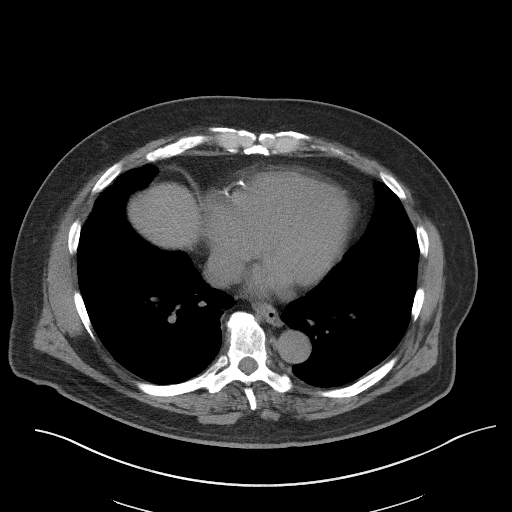

[Series 5: coronal · coronal · 0.87mm/px · 3 of 152 slices shown]
[im 51/152  soft-tissue]
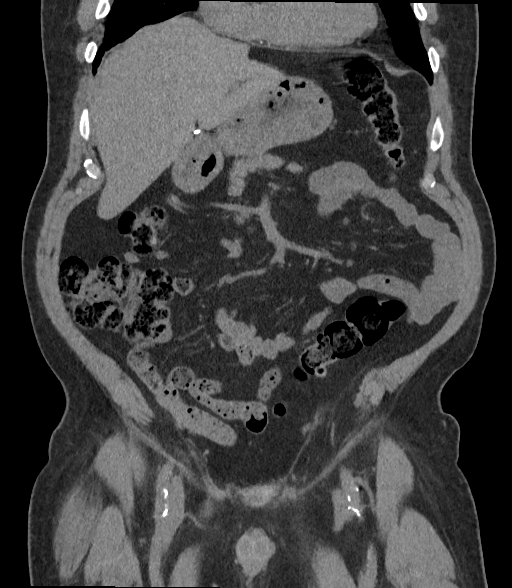
[im 68/152  soft-tissue]
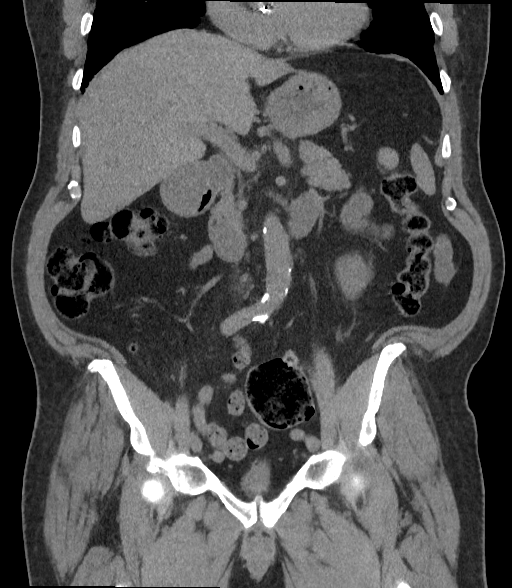
[im 84/152  soft-tissue]
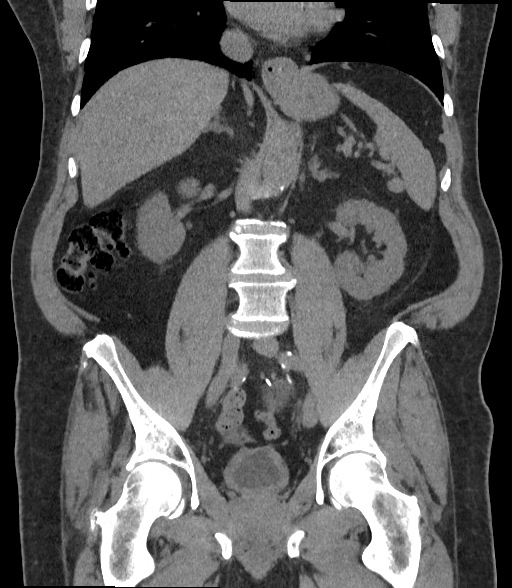

[16 of 46 positions shown; findings below may reference images not displayed]

FINDINGS: Lower chest: No acute abnormality.

Hepatobiliary: No focal liver abnormality is seen. Status post
cholecystectomy. No biliary dilatation.

Pancreas: Unremarkable. No pancreatic ductal dilatation or
surrounding inflammatory changes.

Spleen: Normal in size without focal abnormality.

Adrenals/Urinary Tract: The adrenal glands appear normal. No kidney
stone or hydronephrosis identified. No hydroureter or ureteral
calcification. Partially collapsed urinary bladder is noted. There
is wall thickening involving the anterior bladder which measures up
to 0.9 cm in maximum thickness, image 84/2. No calcification within
the bladder.

Stomach/Bowel: Small hiatal hernia. No dilated loops of bowel. No
bowel wall thickening or inflammation identified. Postop change
involving the sigmoid colon is again noted. There is mild pre
anastomotic dilatation noted, increased from previous exam, image
71/2. No discrete mass noted.

Vascular/Lymphatic: Aortic atherosclerosis. No aneurysm. No
abdominopelvic adenopathy identified.

Reproductive: Prostate is unremarkable.

Other: Bilateral fat containing inguinal hernias are identified left
greater than right

Musculoskeletal: Lumbar spondylosis identified. No aggressive lytic
or sclerotic bone lesions.
IMPRESSION: 1. No acute findings identified within the abdomen or pelvis.
2. Mild anterior bladder wall thickening is identified, nonspecific
but may reflect incomplete distention or sequelae of chronic bladder
outlet obstruction.
3. No nephrolithiasis or hydronephrosis identified.
4. Aortic atherosclerosis.
5. Bilateral fat containing inguinal hernias.

Aortic Atherosclerosis (RH15H-WJH.H).

## 2020-11-15 ENCOUNTER — Ambulatory Visit (INDEPENDENT_AMBULATORY_CARE_PROVIDER_SITE_OTHER): Payer: Medicare Other | Admitting: Urology

## 2020-11-15 ENCOUNTER — Other Ambulatory Visit: Payer: Self-pay

## 2020-11-15 VITALS — BP 160/82 | HR 65 | Wt 256.0 lb

## 2020-11-15 DIAGNOSIS — N5089 Other specified disorders of the male genital organs: Secondary | ICD-10-CM

## 2020-11-15 NOTE — Progress Notes (Signed)
11/15/2020 9:50 AM   Adam Zavala February 19, 1946 [id number]  Referring provider: Adin Hector, MD [place] [facility] [place],  [place] [address]  Chief Complaint  Patient presents with  . Other    Scrotal mass      HPI: 74 year old male with history of right vasitis nodosum status post excision who returns today now with left-sided testicular complaints.  He reports that over the last year or so, he has noticed the same process on the left side is occurred on the right.  He reports that he has a mass outside of the testicle just lateral and superior which feels firm.  It has not really changed since he first noticed it.  He denies any associated pain with this.  He does have a personal history of vasitis nodosum excised in 2017.  With this particular issue, he had pain and discomfort.  He is not had any issues status post excision.  He does report an episode of abdominal pain last year.  Was concerned that he had a kidney stone.  He had a CT scan indicating no stone.  His pain is resolved and not recurred.  He denies any urinary issues.  PMH: Past Medical History:  Diagnosis Date  . A-fib (HCC)    now in normal sinus rhytm  . Aortic stenosis   . Aortic valve replaced 03/2013  . Coronary artery disease    one vessel coronary artery disease, status post DES stent placement at time of aortic valve replacement April 2014. Followed by Dr. Serafina Royals  . Dysrhythmia   . Elevated PSA   . GERD (gastroesophageal reflux disease)    with Barretts esophagus followed by Dr. [name]  . Heart attack ([place])   . Heart murmur   . Hyperlipidemia   . Hypertension   . Pneumonia   . Sleep apnea     Surgical History: Past Surgical History:  Procedure Laterality Date  . achilles tendon rupture repair    . AORTIC VALVE SURGERY    . CARDIAC VALVE REPLACEMENT    . CHOLECYSTECTOMY    . COLON SURGERY    . COLONOSCOPY W/ BIOPSIES AND POLYPECTOMY     3 polyps  removed  . COLONOSCOPY WITH PROPOFOL N/A 04/16/2018   Procedure: COLONOSCOPY WITH PROPOFOL;  Surgeon: [name], MD;  Location[facility] ENDOSCOPY;  Service: Endoscopy;  Laterality: N/A;  . EPIDIDYMECTOMY Right 11/12/2016   Procedure: PARTIAL EPIDIDYMECTOMY;  Surgeon: [name], MD;  Location: [facility] ORS;  Service: Urology;  Laterality: Right;  . partial removal of colon    . PROSTATE BIOPSY    . right shoulder surgery     separation/ dislocated  . ROTATOR CUFF REPAIR    . S/p rectotomy for fissures    . SPHINCTEROTOMY    . VASECTOMY Right 11/12/2016   Procedure: VASECTOMY;  Surgeon: [name], MD;  Location: [facility] ORS;  Service: Urology;  Laterality: Right;    Home Medications:  Allergies as of 11/15/2020   No Known Allergies     Medication List       Accurate as of November 15, 2020  9:50 AM. If you have any questions, ask your nurse or doctor.        STOP taking these medications   HYDROcodone-acetaminophen 5-325 MG tablet Commonly known as: NORCO/VICODIN Stopped by: [name], MD     TAKE these medications   amLODipine 5 MG tablet Commonly known as: NORVASC Take 5 mg  by mouth daily. What changed: Another medication with the same name was removed. Continue taking this medication, and follow the directions you see here. Changed by: [name], MD   aspirin 81 MG tablet Take 81 mg by mouth daily.   atorvastatin 80 MG tablet Commonly known as: LIPITOR Take 80 mg by mouth every evening.   Bystolic 2.5 MG tablet Generic drug: nebivolol Take 2.5 mg by mouth daily.   colchicine 0.6 MG tablet Take 2 tablets (1.2mg ) by mouth at first sign of gout flare followed by 1 tablet (0.6mg ) after 1 hour. (Max 1.8mg  within 1 hour)   docusate sodium 100 MG capsule Commonly known as: COLACE Take 1 capsule (100 mg total) by mouth 2 (two) times daily.   esomeprazole 40 MG capsule Commonly known as: NEXIUM Take 40 mg by mouth daily before breakfast.   FISH  OIL PO Take 1 capsule by mouth once a week.   ibuprofen 200 MG tablet Commonly known as: ADVIL Take 400 mg by mouth every 6 (six) hours as needed for moderate pain.   lisinopril 10 MG tablet Commonly known as: ZESTRIL Take 1 tablet (10 mg total) by mouth 2 (two) times daily.   oxyCODONE-acetaminophen 5-325 MG tablet Commonly known as: PERCOCET/ROXICET Take 1 tablet by mouth every 6 (six) hours as needed for severe pain.   tamsulosin 0.4 MG Caps capsule Commonly known as: FLOMAX Take 0.4 mg by mouth daily.       Allergies: No Known Allergies  Family History: Family History  Problem Relation Age of Onset  . Bladder Cancer Neg Hx   . Prostate cancer Neg Hx   . Kidney cancer Neg Hx     Social History:  reports that he has quit smoking. His smoking use included cigars. He has never used smokeless tobacco. He reports current alcohol use. He reports that he does not use drugs.   Physical Exam: BP (!) 160/82   Pulse 65   Wt 256 lb (116.1 kg)   BMI 32.87 kg/m   Constitutional:  Alert and oriented, No acute distress. HEENT: [place] AT, moist mucus membranes.  Trachea midline, no masses. Cardiovascular: No clubbing, cyanosis, or edema. Respiratory: Normal respiratory effort, no increased work of breathing. GU: Normal descended testicles bilaterally.  Right epididymis is absent.  Left epididymis with firm rugated contour to palpation several centimeters in length, nontender nonfluctuant. Neurologic: Grossly intact, no focal deficits, moving all 4 extremities. Psychiatric: Normal mood and affect.  Assessment & Plan:    1. Scrotal mass Left extratesticular mass consistent with his known history of vasitis nodosum status post excision on the contralateral side.  Overall, he is not having any pain, self exam is remained stable for a year plus.  Given this benign pathology, would not recommend excision unless his exam changes or develops pain.  He is agreeable this  plan.   [name], MD  [facility] [address], [place], [place] [address] [phone]

## 2021-06-09 DIAGNOSIS — I5032 Chronic diastolic (congestive) heart failure: Secondary | ICD-10-CM | POA: Diagnosis present

## 2021-06-09 DIAGNOSIS — Z955 Presence of coronary angioplasty implant and graft: Secondary | ICD-10-CM

## 2021-06-23 ENCOUNTER — Observation Stay
Admission: EM | Admit: 2021-06-23 | Discharge: 2021-06-24 | Disposition: A | Discharge status: Home / Self Care | Payer: Medicare Other | Attending: Internal Medicine | Admitting: Internal Medicine

## 2021-06-23 DIAGNOSIS — Z7982 Long term (current) use of aspirin: Secondary | ICD-10-CM | POA: Diagnosis not present

## 2021-06-23 DIAGNOSIS — Z79899 Other long term (current) drug therapy: Secondary | ICD-10-CM | POA: Diagnosis not present

## 2021-06-23 DIAGNOSIS — Z20822 Contact with and (suspected) exposure to covid-19: Secondary | ICD-10-CM | POA: Insufficient documentation

## 2021-06-23 DIAGNOSIS — I2510 Atherosclerotic heart disease of native coronary artery without angina pectoris: Secondary | ICD-10-CM | POA: Insufficient documentation

## 2021-06-23 DIAGNOSIS — I110 Hypertensive heart disease with heart failure: Secondary | ICD-10-CM | POA: Insufficient documentation

## 2021-06-23 DIAGNOSIS — G4733 Obstructive sleep apnea (adult) (pediatric): Secondary | ICD-10-CM | POA: Diagnosis present

## 2021-06-23 DIAGNOSIS — N390 Urinary tract infection, site not specified: Secondary | ICD-10-CM | POA: Diagnosis not present

## 2021-06-23 DIAGNOSIS — I5032 Chronic diastolic (congestive) heart failure: Secondary | ICD-10-CM | POA: Diagnosis not present

## 2021-06-23 DIAGNOSIS — I4891 Unspecified atrial fibrillation: Principal | ICD-10-CM | POA: Insufficient documentation

## 2021-06-23 DIAGNOSIS — Z87891 Personal history of nicotine dependence: Secondary | ICD-10-CM | POA: Insufficient documentation

## 2021-06-23 DIAGNOSIS — Z955 Presence of coronary angioplasty implant and graft: Secondary | ICD-10-CM

## 2021-06-23 DIAGNOSIS — Z951 Presence of aortocoronary bypass graft: Secondary | ICD-10-CM

## 2021-06-23 DIAGNOSIS — Z953 Presence of xenogenic heart valve: Secondary | ICD-10-CM

## 2021-06-23 MED ORDER — METOPROLOL TARTRATE 5 MG/5ML IV SOLN
5.0000 mg | INTRAVENOUS | Status: DC | PRN
  Administered 2021-06-24: 5 mg via INTRAVENOUS
  Filled 2021-06-23: qty 5

## 2021-06-23 NOTE — ED Provider Notes (Signed)
[facility] Emergency Department Provider Note   ____________________________________________   Event Date/Time   First MD Initiated Contact with Patient 06/23/21 2337     (approximate)  I have reviewed the triage vital signs and the nursing notes.   HISTORY  Chief Complaint Atrial Fibrillation    HPI Adam Zavala is a 75 y.o. male with past medical history of hypertension, hyperlipidemia, GERD, atrial fibrillation, and CAD status post CABG who presents to the ED complaining of palpitations.  Patient reports that he was woken from sleep about 1 hour prior to arrival with sensation of his heart racing similar to when he dealt with A. fib in the past.  He states he previously dealt with A. fib around the time of his aortic valve replacement, but it stopped afterwards and he has not had any other episodes since then.  He does not currently take any anticoagulation.  He denies any chest pain or shortness of breath currently.  He underwent CABG x3 eleven days ago at [facility], states he has been doing well since then.  He was diagnosed with UTI at the time of discharge and is currently taking Cipro.        Past Medical History:  Diagnosis Date   A-fib [facility])    now in normal sinus rhytm   Aortic stenosis    Aortic valve replaced 03/2013   Coronary artery disease    one vessel coronary artery disease, status post DES stent placement at time of aortic valve replacement April 2014. Followed by Dr. Serafina Royals   Dysrhythmia    Elevated PSA    GERD (gastroesophageal reflux disease)    with Barretts esophagus followed by Dr. [name]   Heart attack [facility])    Heart murmur    Hyperlipidemia    Hypertension    Pneumonia    Sleep apnea     Patient Active Problem List   Diagnosis Date Noted   Hx of CABG 06/24/2021   Rapid atrial fibrillation ([place]) 06/24/2021   CHF (congestive heart failure), NYHA class I, chronic, diastolic ([place]) [id number]   Presence of stent  in coronary artery 06/09/2021   History of repair of rotator cuff 04/19/2016   Atrial fibrillation with rapid ventricular response ([place]) 04/10/2016   Elevated prostate specific antigen (PSA) 04/10/2016   HLD (hyperlipidemia) 04/10/2016   Abdominal pain, generalized 12/07/2015   Obstructive apnea 11/10/2014   Arteriosclerosis of coronary artery 03/30/2014   Hypertension    GERD (gastroesophageal reflux disease)    A-fib (HCC)    Aortic stenosis     Past Surgical History:  Procedure Laterality Date   achilles tendon rupture repair     AORTIC VALVE SURGERY     CARDIAC VALVE REPLACEMENT     CHOLECYSTECTOMY     COLON SURGERY     COLONOSCOPY W/ BIOPSIES AND POLYPECTOMY     3 polyps removed   COLONOSCOPY WITH PROPOFOL N/A 04/16/2018   Procedure: COLONOSCOPY WITH PROPOFOL;  Surgeon: [name], MD;  Location: [facility] ENDOSCOPY;  Service: Endoscopy;  Laterality: N/A;   EPIDIDYMECTOMY Right 11/12/2016   Procedure: PARTIAL EPIDIDYMECTOMY;  Surgeon: [name], MD;  Location: [facility] ORS;  Service: Urology;  Laterality: Right;   partial removal of colon     PROSTATE BIOPSY     right shoulder surgery     separation/ dislocated   ROTATOR CUFF REPAIR     S/p rectotomy for fissures     SPHINCTEROTOMY  VASECTOMY Right 11/12/2016   Procedure: VASECTOMY;  Surgeon: [name], MD;  Location: [facility] ORS;  Service: Urology;  Laterality: Right;    Prior to Admission medications   Medication Sig Start Date End Date Taking? Authorizing Provider  amLODipine (NORVASC) 5 MG tablet Take 5 mg by mouth daily. 11/07/20   [provider]  aspirin 81 MG tablet Take 81 mg by mouth daily.    [provider]  atorvastatin (LIPITOR) 80 MG tablet Take 80 mg by mouth every evening.  12/21/15   [provider]  BYSTOLIC 2.5 MG tablet Take 2.5 mg by mouth daily. 10/16/19   [provider]  colchicine 0.6 MG tablet Take 2 tablets (1.'2mg'$ ) by mouth at first sign of gout  flare followed by 1 tablet (0.'6mg'$ ) after 1 hour. (Max 1.'8mg'$  within 1 hour) 07/20/20   [provider]  docusate sodium (COLACE) 100 MG capsule Take 1 capsule (100 mg total) by mouth 2 (two) times daily. 11/12/16   [name], MD  esomeprazole (NEXIUM) 40 MG capsule Take 40 mg by mouth daily before breakfast.    [provider]  ibuprofen (ADVIL,MOTRIN) 200 MG tablet Take 400 mg by mouth every 6 (six) hours as needed for moderate pain.    [provider]  lisinopril (PRINIVIL,ZESTRIL) 10 MG tablet Take 1 tablet (10 mg total) by mouth 2 (two) times daily. 08/10/17   Hinda Kehr, MD  Omega-3 Fatty Acids (FISH OIL PO) Take 1 capsule by mouth once a week.    [provider]  oxyCODONE-acetaminophen (PERCOCET/ROXICET) 5-325 MG tablet Take 1 tablet by mouth every 6 (six) hours as needed for severe pain. 11/17/19   Stoioff, Ronda Fairly, MD  Tamsulosin HCl (FLOMAX) 0.4 MG CAPS Take 0.4 mg by mouth daily.    [provider]    Allergies Patient has no known allergies.  Family History  Problem Relation Age of Onset   Bladder Cancer Neg Hx    Prostate cancer Neg Hx    Kidney cancer Neg Hx     Social History Social History   Tobacco Use   Smoking status: Former    Types: Cigars   Smokeless tobacco: Never  Substance Use Topics   Alcohol use: Yes    Alcohol/week: 0.0 standard drinks    Comment: occasional    Drug use: No    Review of Systems  Constitutional: No fever/chills Eyes: No visual changes. ENT: No sore throat. Cardiovascular: Denies chest pain.  Positive for palpitations. Respiratory: Denies shortness of breath. Gastrointestinal: No abdominal pain.  No nausea, no vomiting.  No diarrhea.  No constipation. Genitourinary: Negative for dysuria. Musculoskeletal: Negative for back pain. Skin: Negative for rash. Neurological: Negative for headaches, focal weakness or numbness.  ____________________________________________   PHYSICAL  EXAM:  VITAL SIGNS: ED Triage Vitals  Enc Vitals Group     BP 06/23/21 2332 (!) 110/91     Pulse Rate 06/23/21 2332 (!) 182     Resp 06/23/21 2332 16     Temp --      Temp Source 06/23/21 2332 Oral     SpO2 06/23/21 2332 100 %     Weight 06/23/21 2333 244 lb (110.7 kg)     Height 06/23/21 2333 '6\' 1"'$  (1.854 m)     Head Circumference --      Peak Flow --      Pain Score --      Pain Loc --      Pain Edu? --  Excl. in [place]? --     Constitutional: Alert and oriented. Eyes: Conjunctivae are normal. Head: Atraumatic. Nose: No congestion/rhinnorhea. Mouth/Throat: Mucous membranes are moist. Neck: Normal ROM Cardiovascular: Tachycardic, irregularly irregular rhythm. Grossly normal heart sounds.  2+ radial pulses bilaterally. Respiratory: Normal respiratory effort.  No retractions. Lungs CTAB.  CABG incision well-healed with staples in place, no surrounding erythema, warmth, or tenderness. Gastrointestinal: Soft and nontender. No distention. Genitourinary: deferred Musculoskeletal: No lower extremity tenderness nor edema. Neurologic:  Normal speech and language. No gross focal neurologic deficits are appreciated. Skin:  Skin is warm, dry and intact. No rash noted. Psychiatric: Mood and affect are normal. Speech and behavior are normal.  ____________________________________________   LABS (all labs ordered are listed, but only abnormal results are displayed)  Labs Reviewed  BASIC METABOLIC PANEL - Abnormal; Notable for the following components:      Result Value   Sodium 134 (*)    Glucose, Bld 133 (*)    Creatinine, Ser 1.39 (*)    Calcium 8.8 (*)    GFR, Estimated 53 (*)    All other components within normal limits  CBC - Abnormal; Notable for the following components:   RBC 3.18 (*)    Hemoglobin 10.4 (*)    HCT 31.2 (*)    Platelets 471 (*)    nRBC 0.3 (*)    All other components within normal limits  RESP PANEL BY RT-PCR (FLU A&B, COVID) ARPGX2  MAGNESIUM   TROPONIN I (HIGH SENSITIVITY)  TROPONIN I (HIGH SENSITIVITY)   ____________________________________________  EKG  ED ECG REPORT I, Blake Divine, the attending physician, personally viewed and interpreted this ECG.   Date: 06/23/2021  EKG Time: 23:33  Rate: 174  Rhythm: atrial fibrillation  Axis: Normal  Intervals:none  ST&T Change: Inferolateral T wave inversions   PROCEDURES  Procedure(s) performed (including Critical Care):  .Critical Care  Date/Time: 06/24/2021 1:34 AM Performed by: Blake Divine, MD Authorized by: Blake Divine, MD   Critical care provider statement:    Critical care time (minutes):  45   Critical care time was exclusive of:  Separately billable procedures and treating other patients and teaching time   Critical care was necessary to treat or prevent imminent or life-threatening deterioration of the following conditions:  Cardiac failure   Critical care was time spent personally by me on the following activities:  Discussions with consultants, evaluation of patient's response to treatment, examination of patient, ordering and performing treatments and interventions, ordering and review of laboratory studies, ordering and review of radiographic studies, pulse oximetry, re-evaluation of patient's condition, obtaining history from patient or surrogate and review of old charts   I assumed direction of critical care for this patient from another provider in my specialty: no     Care discussed with: admitting provider     ____________________________________________   INITIAL IMPRESSION / [place] / ED COURSE      75 year old male with past medical history of hypertension, hyperlipidemia, CAD status post CABG, atrial fibrillation, and GERD who presents to the ED complaining of palpitations waking him from sleep about 1 hour prior to arrival.  EKG shows atrial fibrillation with RVR along with inferior T wave inversions, patient denies any  chest pain or shortness of breath.  Labs are pending, patient with improvement in heart rate following dose of IV metoprolol, we will start on a Cardizem drip.  Patient now converted to normal sinus rhythm following initiation of Cardizem drip. Labs are remarkable only  for mild AKI, troponin within normal limits. We will admit so patient may be observed and transitioned off of cardizem drip. Case discussed with hospitalist for admission.      ____________________________________________   FINAL CLINICAL IMPRESSION(S) / ED DIAGNOSES  Final diagnoses:  Atrial fibrillation with RVR (HCC)  S/P CABG (coronary artery bypass graft)     ED Discharge Orders     None        Note:  This document was prepared using Dragon voice recognition software and may include unintentional dictation errors.    Blake Divine, MD 06/24/21 [phone]

## 2021-06-23 NOTE — ED Triage Notes (Signed)
Pt with cabg x3 and was just released from [facility] this week. Pt states he feels like he is in a fib. Pt denies dizziness, chest pain.

## 2021-06-24 ENCOUNTER — Other Ambulatory Visit: Payer: Medicare Other

## 2021-06-24 ENCOUNTER — Emergency Department: Payer: Medicare Other

## 2021-06-24 DIAGNOSIS — I4891 Unspecified atrial fibrillation: Secondary | ICD-10-CM | POA: Diagnosis not present

## 2021-06-24 DIAGNOSIS — Z953 Presence of xenogenic heart valve: Secondary | ICD-10-CM

## 2021-06-24 DIAGNOSIS — Z951 Presence of aortocoronary bypass graft: Secondary | ICD-10-CM

## 2021-06-24 LAB — CBC
HCT: 31.2 % — ABNORMAL LOW (ref 39.0–52.0)
Hemoglobin: 10.4 g/dL — ABNORMAL LOW (ref 13.0–17.0)
MCH: 32.7 pg (ref 26.0–34.0)
MCHC: 33.3 g/dL (ref 30.0–36.0)
MCV: 98.1 fL (ref 80.0–100.0)
Platelets: 471 10*3/uL — ABNORMAL HIGH (ref 150–400)
RBC: 3.18 MIL/uL — ABNORMAL LOW (ref 4.22–5.81)
RDW: 14 % (ref 11.5–15.5)
WBC: 9.5 10*3/uL (ref 4.0–10.5)
nRBC: 0.3 % — ABNORMAL HIGH (ref 0.0–0.2)

## 2021-06-24 LAB — RESP PANEL BY RT-PCR (FLU A&B, COVID) ARPGX2
Influenza A by PCR: NEGATIVE
Influenza B by PCR: NEGATIVE
SARS Coronavirus 2 by RT PCR: NEGATIVE

## 2021-06-24 LAB — BASIC METABOLIC PANEL
Anion gap: 9 (ref 5–15)
BUN: 20 mg/dL (ref 8–23)
CO2: 24 mmol/L (ref 22–32)
Calcium: 8.8 mg/dL — ABNORMAL LOW (ref 8.9–10.3)
Chloride: 101 mmol/L (ref 98–111)
Creatinine, Ser: 1.39 mg/dL — ABNORMAL HIGH (ref 0.61–1.24)
GFR, Estimated: 53 mL/min — ABNORMAL LOW (ref >60)
Glucose, Bld: 133 mg/dL — ABNORMAL HIGH (ref 70–99)
Potassium: 4.3 mmol/L (ref 3.5–5.1)
Sodium: 134 mmol/L — ABNORMAL LOW (ref 135–145)

## 2021-06-24 LAB — TROPONIN I (HIGH SENSITIVITY)
Troponin I (High Sensitivity): 15 ng/L (ref <18)
Troponin I (High Sensitivity): 16 ng/L (ref <18)

## 2021-06-24 LAB — MAGNESIUM: Magnesium: 2.1 mg/dL (ref 1.7–2.4)

## 2021-06-24 MED ORDER — METOPROLOL TARTRATE 25 MG PO TABS
25.0000 mg | ORAL_TABLET | Freq: Two times a day (BID) | ORAL | Status: DC
  Administered 2021-06-24: 25 mg via ORAL
  Filled 2021-06-24: qty 1

## 2021-06-24 MED ORDER — HYDROCODONE-ACETAMINOPHEN 5-325 MG PO TABS: 1.0000 | ORAL_TABLET | ORAL | Status: DC | PRN

## 2021-06-24 MED ORDER — DILTIAZEM HCL ER COATED BEADS 180 MG PO CP24
180.0000 mg | ORAL_CAPSULE | Freq: Every day | ORAL | Status: DC
  Administered 2021-06-24: 180 mg via ORAL
  Filled 2021-06-24 (×2): qty 1

## 2021-06-24 MED ORDER — DILTIAZEM HCL-DEXTROSE 125-5 MG/125ML-% IV SOLN (PREMIX)
5.0000 mg/h | INTRAVENOUS | Status: DC
  Administered 2021-06-24: 5 mg/h via INTRAVENOUS
  Filled 2021-06-24: qty 125

## 2021-06-24 MED ORDER — DILTIAZEM HCL-DEXTROSE 125-5 MG/125ML-% IV SOLN (PREMIX)
5.0000 mg/h | INTRAVENOUS | Status: DC
  Administered 2021-06-24: 5 mg/h via INTRAVENOUS

## 2021-06-24 MED ORDER — APIXABAN 5 MG PO TABS: 5.0000 mg | ORAL_TABLET | Freq: Two times a day (BID) | ORAL | Status: DC

## 2021-06-24 MED ORDER — ENOXAPARIN SODIUM 60 MG/0.6ML IJ SOSY
0.5000 mg/kg | PREFILLED_SYRINGE | INTRAMUSCULAR | Status: DC
  Administered 2021-06-24: 55 mg via SUBCUTANEOUS
  Filled 2021-06-24: qty 0.6

## 2021-06-24 MED ORDER — ACETAMINOPHEN 325 MG PO TABS: 650.0000 mg | ORAL_TABLET | Freq: Four times a day (QID) | ORAL | Status: DC | PRN

## 2021-06-24 MED ORDER — DILTIAZEM HCL 25 MG/5ML IV SOLN
10.0000 mg | Freq: Once | INTRAVENOUS | Status: AC
  Administered 2021-06-24: 10 mg via INTRAVENOUS
  Filled 2021-06-24: qty 5

## 2021-06-24 MED ORDER — DILTIAZEM HCL ER COATED BEADS 180 MG PO CP24
180.0000 mg | ORAL_CAPSULE | Freq: Every day | ORAL | 0 refills | Status: DC
Start: 2021-06-24 — End: 2022-04-05

## 2021-06-24 MED ORDER — CIPROFLOXACIN HCL 500 MG PO TABS
500.0000 mg | ORAL_TABLET | Freq: Two times a day (BID) | ORAL | Status: DC
  Administered 2021-06-24: 500 mg via ORAL
  Filled 2021-06-24: qty 1

## 2021-06-24 MED ORDER — APIXABAN 5 MG PO TABS: 5.0000 mg | ORAL_TABLET | Freq: Two times a day (BID) | ORAL | 0 refills | Status: DC

## 2021-06-24 MED ORDER — ACETAMINOPHEN 650 MG RE SUPP: 650.0000 mg | Freq: Four times a day (QID) | RECTAL | Status: DC | PRN

## 2021-06-24 MED ORDER — METOPROLOL TARTRATE 25 MG PO TABS: 25.0000 mg | ORAL_TABLET | Freq: Two times a day (BID) | ORAL | 0 refills | Status: DC

## 2021-06-24 MED ORDER — DILTIAZEM HCL 60 MG PO TABS
30.0000 mg | ORAL_TABLET | Freq: Four times a day (QID) | ORAL | Status: DC
  Filled 2021-06-24: qty 1

## 2021-06-24 MED ORDER — ONDANSETRON HCL 4 MG/2ML IJ SOLN: 4.0000 mg | Freq: Four times a day (QID) | INTRAMUSCULAR | Status: DC | PRN

## 2021-06-24 MED ORDER — ONDANSETRON HCL 4 MG PO TABS: 4.0000 mg | ORAL_TABLET | Freq: Four times a day (QID) | ORAL | Status: DC | PRN

## 2021-06-24 NOTE — ED Notes (Signed)
Per previous orders from attending MD, administered PO medication - plan to d/c drip (see mar) in one hour.

## 2021-06-24 NOTE — Progress Notes (Signed)
Anticoagulation monitoring(Lovenox):  75 yo male ordered Lovenox 40 mg Q24h    Filed Weights   06/23/21 2333  Weight: 110.7 kg (244 lb)   BMI 32   Lab Results  Component Value Date   CREATININE 1.39 (H) 06/23/2021   CREATININE 1.17 08/10/2017   Estimated Creatinine Clearance: 59.9 mL/min (A) (by C-G formula based on SCr of 1.39 mg/dL (H)). Hemoglobin & Hematocrit     Component Value Date/Time   HGB 10.4 (L) 06/23/2021 2345   HCT 31.2 (L) 06/23/2021 2345     Per Protocol for Patient with estCrcl > 30 ml/min and BMI > 30, will transition to Lovenox 55  mg Q24h.

## 2021-06-24 NOTE — ED Notes (Signed)
Pt ambulated approximately 247f with steady gait, denies dizziness or shortness of breath. HR prior to ambulation 82, sinus rhythm. HR after ambulation 79, sinus rhythm. Will message attending provider.

## 2021-06-24 NOTE — Discharge Summary (Signed)
Adam Zavala [id number] DOB: [birth year] DOA: 06/23/2021  PCP: Adam Hector, MD  Admit date: 06/23/2021 Discharge date: 06/24/2021  Admitted From: home Disposition:  home  Recommendations for Outpatient Follow-up:  Follow up with PCP in 1 week Please obtain BMP/CBC in one week Please follow up with cardiology in one week      Discharge Condition:Stable CODE STATUS:Full  Diet recommendation: Heart Healthy  Brief/Interim Summary: Per HPI: Adam Zavala is a 75 y.o. male with medical history significant for Diastolic CHF, bioprosthetic AVR, paroxysmal A. fib x1 episode following AVR, not on anticoagulation, OSA, who is s/p elective uncomplicated CABG x3 on Q000111Q, discharged on 7/20, currently on oral antibiotics for UTI who presents to the ED with sudden onset palpitations, similar to prior episode of A. fib following his aortic valve replacement.  He denied associated chest pain or shortness of breath.   ED course: On arrival, patient in A. fib at a rate of 172 with BP 91/65 and otherwise normal vitals. Cardiology was consulted.  Patient was started on Cardizem drip.  He converted to sinus rhythm.  Atrial fibrillation with rapid ventricular response (HCC) -History of single prior episode of paroxysmal A. fib in 2014 not currently on anticoagulation -Was started on Cardizem drip.  Cardiology was consulted. He was converted to sinus rhythm on Cardizem drip.  He was started on p.o. Cardizem and Eliquis by cardiology. He ambulated prior to leaving with his heart rate remaining in sinus rhythm Follow-up with Dr. Nehemiah Zavala next week   CAD  s/p CABG x 3 on 06/13/2021 - First troponin negative, EKG nonacute and patient denies chest pain - No acute complications suspected at this time - Continue aspirin, atorvastatin.     UTI Continue home ciprofloxacin to complete course     Obstructive apnea Needs to use CPAP     CHF chronic, diastolic (HCC) - Euvolemic - Continue home  meds      History of aortic valve replacement with bioprosthetic valve Managed by cardiology     Discharge Diagnoses:  Principal Problem:   Atrial fibrillation with rapid ventricular response ([place]) Active Problems:   Obstructive apnea   S/P CABG x 3 06/13/2021   CHF (congestive heart failure), NYHA class I, chronic, diastolic (HCC)   Presence of stent in coronary artery   History of aortic valve replacement with bioprosthetic valve    Discharge Instructions  Discharge Instructions     Amb referral to AFIB Clinic   Complete by: As directed    Call MD for:  difficulty breathing, headache or visual disturbances   Complete by: As directed    Diet - low sodium heart healthy   Complete by: As directed    Increase activity slowly   Complete by: As directed       Allergies as of 06/24/2021   No Known Allergies      Medication List     STOP taking these medications    acetaminophen 325 MG tablet Commonly known as: TYLENOL   amLODipine 5 MG tablet Commonly known as: NORVASC   Bystolic 2.5 MG tablet Generic drug: nebivolol   clopidogrel 75 MG tablet Commonly known as: PLAVIX   colchicine 0.6 MG tablet   docusate sodium 100 MG capsule Commonly known as: COLACE   FISH OIL PO   ibuprofen 200 MG tablet Commonly known as: ADVIL   isosorbide mononitrate 30 MG 24 hr tablet Commonly known as: IMDUR   lisinopril 10 MG tablet Commonly  known as: ZESTRIL   oxyCODONE-acetaminophen 5-325 MG tablet Commonly known as: PERCOCET/ROXICET       TAKE these medications    apixaban 5 MG Tabs tablet Commonly known as: ELIQUIS Take 1 tablet (5 mg total) by mouth 2 (two) times daily.   aspirin 81 MG tablet Take 81 mg by mouth daily.   atorvastatin 80 MG tablet Commonly known as: LIPITOR Take 80 mg by mouth every evening.   ciprofloxacin 500 MG tablet Commonly known as: CIPRO Take 500 mg by mouth every 12 (twelve) hours.   diltiazem 180 MG 24 hr  capsule Commonly known as: CARDIZEM CD Take 1 capsule (180 mg total) by mouth daily.   esomeprazole 40 MG capsule Commonly known as: NEXIUM Take 40 mg by mouth daily before breakfast.   metoprolol tartrate 25 MG tablet Commonly known as: LOPRESSOR Take 1 tablet (25 mg total) by mouth 2 (two) times daily. What changed:  how much to take when to take this   tamsulosin 0.4 MG Caps capsule Commonly known as: FLOMAX Take 0.4 mg by mouth daily.       ASK your doctor about these medications    nitroGLYCERIN 0.4 MG SL tablet Commonly known as: NITROSTAT Place 0.5 mg under the tongue as directed.        Follow-up Information     Adam Skains, MD Follow up in 1 week(s).   Specialty: Cardiology Contact information: [address] [facility] [place] [address] [phone]                No Known Allergies  Consultations: Cardiology   Procedures/Studies: DG Chest Portable 1 View  Result Date: 06/24/2021 CLINICAL DATA:  Patient feels like he is in AFib. EXAM: PORTABLE CHEST 1 VIEW COMPARISON:  June 14, 2021 FINDINGS: Multiple sternal wires and vascular clips are seen. Decreased lung volumes are noted which is likely secondary to the degree of patient inspiration. Mild atelectasis is suspected within the retrocardiac region of the left lung base. There is no evidence of a pleural effusion or pneumothorax. The cardiac silhouette is mildly enlarged. Degenerative changes seen throughout the thoracic spine. IMPRESSION: Mild cardiomegaly with prior median sternotomy. Electronically Signed   By: Adam Zavala M.D.   On: 06/24/2021 00:21      Subjective: No chest pain, shortness of breath, dizziness  Discharge Exam: Vitals:   06/24/21 1100 06/24/21 1121  BP: 125/78 125/78  Pulse: 76 82  Resp: 14   Temp:    SpO2: 95%    Vitals:   06/24/21 0930 06/24/21 1000 06/24/21 1100 06/24/21 1121  BP: 119/73 122/66 125/78 125/78   Pulse: 78 83 76 82  Resp: 19 (!) 24 14   Temp:      TempSrc:      SpO2: 92% 94% 95%   Weight:      Height:        General: Pt is alert, awake, not in acute distress Cardiovascular: RRR, S1/S2 +, no rubs, no gallops Respiratory: CTA bilaterally, no wheezing, no rhonchi Abdominal: Soft, NT, ND, bowel sounds + Extremities: no edema, no cyanosis    The results of significant diagnostics from this hospitalization (including imaging, microbiology, ancillary and laboratory) are listed below for reference.     Microbiology: Recent Results (from the past 240 hour(s))  Resp Panel by RT-PCR (Flu A&B, Covid) Nasopharyngeal Swab     Status: None   Collection Time: 06/23/21 11:45 PM   Specimen: Nasopharyngeal Swab; Nasopharyngeal(NP) swabs  in vial transport medium  Result Value Ref Range Status   SARS Coronavirus 2 by RT PCR NEGATIVE NEGATIVE Final    Comment: (NOTE) SARS-CoV-2 target nucleic acids are NOT DETECTED.  The SARS-CoV-2 RNA is generally detectable in upper respiratory specimens during the acute phase of infection. The lowest concentration of SARS-CoV-2 viral copies this assay can detect is 138 copies/mL. A negative result does not preclude SARS-Cov-2 infection and should not be used as the sole basis for treatment or other patient management decisions. A negative result may occur with  improper specimen collection/handling, submission of specimen other than nasopharyngeal swab, presence of viral mutation(s) within the areas targeted by this assay, and inadequate number of viral copies(<138 copies/mL). A negative result must be combined with clinical observations, patient history, and epidemiological information. The expected result is Negative.  Fact Sheet for Patients:  EntrepreneurPulse.com.au  Fact Sheet for Healthcare Providers:  IncredibleEmployment.be  This test is no t yet approved or cleared by the [place] FDA and  has  been authorized for detection and/or diagnosis of SARS-CoV-2 by FDA under an Emergency Use Authorization (EUA). This EUA will remain  in effect (meaning this test can be used) for the duration of the COVID-19 declaration under Section 564(b)(1) of the Act, 21 U.S.C.section 360bbb-3(b)(1), unless the authorization is terminated  or revoked sooner.       Influenza A by PCR NEGATIVE NEGATIVE Final   Influenza B by PCR NEGATIVE NEGATIVE Final    Comment: (NOTE) The Xpert Xpress SARS-CoV-2/FLU/RSV plus assay is intended as an aid in the diagnosis of influenza from Nasopharyngeal swab specimens and should not be used as a sole basis for treatment. Nasal washings and aspirates are unacceptable for Xpert Xpress SARS-CoV-2/FLU/RSV testing.  Fact Sheet for Patients: EntrepreneurPulse.com.au  Fact Sheet for Healthcare Providers: IncredibleEmployment.be  This test is not yet approved or cleared by the [place] FDA and has been authorized for detection and/or diagnosis of SARS-CoV-2 by FDA under an Emergency Use Authorization (EUA). This EUA will remain in effect (meaning this test can be used) for the duration of the COVID-19 declaration under Section 564(b)(1) of the Act, 21 U.S.C. section 360bbb-3(b)(1), unless the authorization is terminated or revoked.  Performed at [facility], [address] [place], [place] [address]      Labs: BNP (last 3 results) No results for input(s): BNP in the last 8760 hours. Basic Metabolic Panel: Recent Labs  Lab 06/23/21 2345  NA 134*  K 4.3  CL 101  CO2 24  GLUCOSE 133*  BUN 20  CREATININE 1.39*  CALCIUM 8.8*  MG 2.1   Liver Function Tests: No results for input(s): AST, ALT, ALKPHOS, BILITOT, PROT, ALBUMIN in the last 168 hours. No results for input(s): LIPASE, AMYLASE in the last 168 hours. No results for input(s): AMMONIA in the last 168 hours. CBC: Recent Labs  Lab 06/23/21 2345   WBC 9.5  HGB 10.4*  HCT 31.2*  MCV 98.1  PLT 471*   Cardiac Enzymes: No results for input(s): CKTOTAL, CKMB, CKMBINDEX, TROPONINI in the last 168 hours. BNP: Invalid input(s): POCBNP CBG: No results for input(s): GLUCAP in the last 168 hours. D-Dimer No results for input(s): DDIMER in the last 72 hours. Hgb A1c No results for input(s): HGBA1C in the last 72 hours. Lipid Profile No results for input(s): CHOL, HDL, LDLCALC, TRIG, CHOLHDL, LDLDIRECT in the last 72 hours. Thyroid function studies No results for input(s): TSH, T4TOTAL, T3FREE, THYROIDAB in the last 72  hours.  Invalid input(s): FREET3 Anemia work up No results for input(s): VITAMINB12, FOLATE, FERRITIN, TIBC, IRON, RETICCTPCT in the last 72 hours. Urinalysis    Component Value Date/Time   APPEARANCEUR Clear 11/17/2019 1441   GLUCOSEU Negative 11/17/2019 1441   BILIRUBINUR Negative 11/17/2019 1441   PROTEINUR Negative 11/17/2019 1441   NITRITE Negative 11/17/2019 1441   LEUKOCYTESUR Negative 11/17/2019 1441   Sepsis Labs Invalid input(s): PROCALCITONIN,  WBC,  LACTICIDVEN Microbiology Recent Results (from the past 240 hour(s))  Resp Panel by RT-PCR (Flu A&B, Covid) Nasopharyngeal Swab     Status: None   Collection Time: 06/23/21 11:45 PM   Specimen: Nasopharyngeal Swab; Nasopharyngeal(NP) swabs in vial transport medium  Result Value Ref Range Status   SARS Coronavirus 2 by RT PCR NEGATIVE NEGATIVE Final    Comment: (NOTE) SARS-CoV-2 target nucleic acids are NOT DETECTED.  The SARS-CoV-2 RNA is generally detectable in upper respiratory specimens during the acute phase of infection. The lowest concentration of SARS-CoV-2 viral copies this assay can detect is 138 copies/mL. A negative result does not preclude SARS-Cov-2 infection and should not be used as the sole basis for treatment or other patient management decisions. A negative result may occur with  improper specimen collection/handling, submission  of specimen other than nasopharyngeal swab, presence of viral mutation(s) within the areas targeted by this assay, and inadequate number of viral copies(<138 copies/mL). A negative result must be combined with clinical observations, patient history, and epidemiological information. The expected result is Negative.  Fact Sheet for Patients:  EntrepreneurPulse.com.au  Fact Sheet for Healthcare Providers:  IncredibleEmployment.be  This test is no t yet approved or cleared by the [place] FDA and  has been authorized for detection and/or diagnosis of SARS-CoV-2 by FDA under an Emergency Use Authorization (EUA). This EUA will remain  in effect (meaning this test can be used) for the duration of the COVID-19 declaration under Section 564(b)(1) of the Act, 21 U.S.C.section 360bbb-3(b)(1), unless the authorization is terminated  or revoked sooner.       Influenza A by PCR NEGATIVE NEGATIVE Final   Influenza B by PCR NEGATIVE NEGATIVE Final    Comment: (NOTE) The Xpert Xpress SARS-CoV-2/FLU/RSV plus assay is intended as an aid in the diagnosis of influenza from Nasopharyngeal swab specimens and should not be used as a sole basis for treatment. Nasal washings and aspirates are unacceptable for Xpert Xpress SARS-CoV-2/FLU/RSV testing.  Fact Sheet for Patients: EntrepreneurPulse.com.au  Fact Sheet for Healthcare Providers: IncredibleEmployment.be  This test is not yet approved or cleared by the [place] FDA and has been authorized for detection and/or diagnosis of SARS-CoV-2 by FDA under an Emergency Use Authorization (EUA). This EUA will remain in effect (meaning this test can be used) for the duration of the COVID-19 declaration under Section 564(b)(1) of the Act, 21 U.S.C. section 360bbb-3(b)(1), unless the authorization is terminated or revoked.  Performed at [facility], [address], [place], [place] [address]      Time coordinating discharge: Over 30 minutes  SIGNED:   Nolberto Hanlon, MD  [facility] 06/24/2021, 12:56 PM Pager   If 7PM-7AM, please contact night-coverage [email or web address] Password [email or web address]

## 2021-06-24 NOTE — H&P (Signed)
History and Physical    Adam Zavala [id number] DOB: 07-22-1946 DOA: 06/23/2021  PCP: Adin Hector, MD   Patient coming from: home  I have personally briefly reviewed patient's old medical records in [place]  Chief Complaint: Palpitations, feels like A. fib  HPI: Adam Zavala is a 75 y.o. male with medical history significant for Diastolic CHF, bioprosthetic AVR, paroxysmal A. fib x1 episode following AVR, not on anticoagulation, OSA, who is s/p elective uncomplicated CABG x3 on Q000111Q, discharged on 7/20, currently on oral antibiotics for UTI who presents to the ED with sudden onset palpitations, similar to prior episode of A. fib following his aortic valve replacement.  He denied associated chest pain or shortness of breath.  Denies cough, fever or chills.  Had no nausea, vomiting or diaphoresis and denies abdominal pain or diarrhea.  Denies headache or visual disturbance or one-sided weakness numbness or tingling  ED course: On arrival, patient in A. fib at a rate of 172 with BP 91/65 and otherwise normal vitals Blood work significant for mild anemia of 10.4 improved from 8.2 on 7/20, creatinine 1.39 up from 1.0 on 7/20, troponin 15, magnesium 2.1, potassium normal.  EKG, personally viewed and interpreted: A. fib with RVR of 174 with nonspecific ST-T wave changes  Imaging: Chest x-ray showing mild cardiomegaly with prior median sternotomy  Patient was given a diltiazem bolus and started on diltiazem infusion with conversion to sinus rhythm.  Being transitioned to oral diltiazem at this time.  Hospitalist consulted for admission.  Review of Systems: As per HPI otherwise all other systems on review of systems negative.    Past Medical History:  Diagnosis Date   A-fib [facility])    now in normal sinus rhytm   Aortic stenosis    Aortic valve replaced 03/2013   Coronary artery disease    one vessel coronary artery disease, status post DES stent placement at time of  aortic valve replacement April 2014. Followed by Dr. Serafina Royals   Dysrhythmia    Elevated PSA    GERD (gastroesophageal reflux disease)    with Barretts esophagus followed by Dr. [name]   Heart attack [facility])    Heart murmur    Hyperlipidemia    Hypertension    Pneumonia    Sleep apnea     Past Surgical History:  Procedure Laterality Date   achilles tendon rupture repair     AORTIC VALVE SURGERY     CARDIAC VALVE REPLACEMENT     CHOLECYSTECTOMY     COLON SURGERY     COLONOSCOPY W/ BIOPSIES AND POLYPECTOMY     3 polyps removed   COLONOSCOPY WITH PROPOFOL N/A 04/16/2018   Procedure: COLONOSCOPY WITH PROPOFOL;  Surgeon: [name], MD;  Location: [facility] ENDOSCOPY;  Service: Endoscopy;  Laterality: N/A;   EPIDIDYMECTOMY Right 11/12/2016   Procedure: PARTIAL EPIDIDYMECTOMY;  Surgeon: [name], MD;  Location: [facility] ORS;  Service: Urology;  Laterality: Right;   partial removal of colon     PROSTATE BIOPSY     right shoulder surgery     separation/ dislocated   ROTATOR CUFF REPAIR     S/p rectotomy for fissures     SPHINCTEROTOMY     VASECTOMY Right 11/12/2016   Procedure: VASECTOMY;  Surgeon: [name], MD;  Location: [facility] ORS;  Service: Urology;  Laterality: Right;     reports that he has quit smoking. His smoking use included cigars. He has never used smokeless  tobacco. He reports current alcohol use. He reports that he does not use drugs.  No Known Allergies  Family History  Problem Relation Age of Onset   Bladder Cancer Neg Hx    Prostate cancer Neg Hx    Kidney cancer Neg Hx       Prior to Admission medications   Medication Sig Start Date End Date Taking? Authorizing Provider  amLODipine (NORVASC) 5 MG tablet Take 5 mg by mouth daily. 11/07/20   [provider]  aspirin 81 MG tablet Take 81 mg by mouth daily.    [provider]  atorvastatin (LIPITOR) 80 MG tablet Take 80 mg by mouth every evening.  12/21/15   [provider]  BYSTOLIC 2.5 MG tablet Take 2.5 mg by mouth daily. 10/16/19   [provider]  colchicine 0.6 MG tablet Take 2 tablets (1.'2mg'$ ) by mouth at first sign of gout flare followed by 1 tablet (0.'6mg'$ ) after 1 hour. (Max 1.'8mg'$  within 1 hour) 07/20/20   [provider]  docusate sodium (COLACE) 100 MG capsule Take 1 capsule (100 mg total) by mouth 2 (two) times daily. 11/12/16   [name], MD  esomeprazole (NEXIUM) 40 MG capsule Take 40 mg by mouth daily before breakfast.    [provider]  ibuprofen (ADVIL,MOTRIN) 200 MG tablet Take 400 mg by mouth every 6 (six) hours as needed for moderate pain.    [provider]  lisinopril (PRINIVIL,ZESTRIL) 10 MG tablet Take 1 tablet (10 mg total) by mouth 2 (two) times daily. 08/10/17   Hinda Kehr, MD  Omega-3 Fatty Acids (FISH OIL PO) Take 1 capsule by mouth once a week.    [provider]  oxyCODONE-acetaminophen (PERCOCET/ROXICET) 5-325 MG tablet Take 1 tablet by mouth every 6 (six) hours as needed for severe pain. 11/17/19   Stoioff, Ronda Fairly, MD  Tamsulosin HCl (FLOMAX) 0.4 MG CAPS Take 0.4 mg by mouth daily.    [provider]    Physical Exam: Vitals:   06/24/21 0015 06/24/21 0045 06/24/21 0100 06/24/21 0115  BP: 99/68 98/67 101/71 99/62  Pulse: (!) 104 89 91 89  Resp: '10 14 16   '$ TempSrc:      SpO2: 95% 92% 93% 93%  Weight:      Height:         Vitals:   06/24/21 0015 06/24/21 0045 06/24/21 0100 06/24/21 0115  BP: 99/68 98/67 101/71 99/62  Pulse: (!) 104 89 91 89  Resp: '10 14 16   '$ TempSrc:      SpO2: 95% 92% 93% 93%  Weight:      Height:        Constitutional: Alert and oriented x 3 . Not in any apparent distress HEENT:      Head: Normocephalic and atraumatic.         Eyes: PERLA, EOMI, Conjunctivae are normal. Sclera is non-icteric.       Mouth/Throat: Mucous membranes are moist.       Neck: Supple with no signs of meningismus. Cardiovascular: Regular rate and rhythm. No  murmurs, gallops, or rubs. 2+ symmetrical distal pulses are present . No JVD. No LE edema Respiratory: Respiratory effort normal .Lungs sounds clear bilaterally. No wheezes, crackles, or rhonchi.  Gastrointestinal: Soft, non tender, and non distended with positive bowel sounds.  Genitourinary: No CVA tenderness. Musculoskeletal: Nontender with normal range of motion in all extremities. No cyanosis, or erythema of extremities. Neurologic:  Face is symmetric. Moving all extremities. No  gross focal neurologic deficits . Skin: Skin is warm, dry.  No rash or ulcers Psychiatric: Mood and affect are normal    Labs on Admission: I have personally reviewed following labs and imaging studies  CBC: Recent Labs  Lab 06/23/21 2345  WBC 9.5  HGB 10.4*  HCT 31.2*  MCV 98.1  PLT 99991111*   Basic Metabolic Panel: Recent Labs  Lab 06/23/21 2345  NA 134*  K 4.3  CL 101  CO2 24  GLUCOSE 133*  BUN 20  CREATININE 1.39*  CALCIUM 8.8*  MG 2.1   GFR: Estimated Creatinine Clearance: 59.9 mL/min (A) (by C-G formula based on SCr of 1.39 mg/dL (H)). Liver Function Tests: No results for input(s): AST, ALT, ALKPHOS, BILITOT, PROT, ALBUMIN in the last 168 hours. No results for input(s): LIPASE, AMYLASE in the last 168 hours. No results for input(s): AMMONIA in the last 168 hours. Coagulation Profile: No results for input(s): INR, PROTIME in the last 168 hours. Cardiac Enzymes: No results for input(s): CKTOTAL, CKMB, CKMBINDEX, TROPONINI in the last 168 hours. BNP (last 3 results) No results for input(s): PROBNP in the last 8760 hours. HbA1C: No results for input(s): HGBA1C in the last 72 hours. CBG: No results for input(s): GLUCAP in the last 168 hours. Lipid Profile: No results for input(s): CHOL, HDL, LDLCALC, TRIG, CHOLHDL, LDLDIRECT in the last 72 hours. Thyroid Function Tests: No results for input(s): TSH, T4TOTAL, FREET4, T3FREE, THYROIDAB in the last 72 hours. Anemia Panel: No results for  input(s): VITAMINB12, FOLATE, FERRITIN, TIBC, IRON, RETICCTPCT in the last 72 hours. Urine analysis:    Component Value Date/Time   APPEARANCEUR Clear 11/17/2019 1441   GLUCOSEU Negative 11/17/2019 1441   BILIRUBINUR Negative 11/17/2019 1441   PROTEINUR Negative 11/17/2019 1441   NITRITE Negative 11/17/2019 1441   LEUKOCYTESUR Negative 11/17/2019 1441    Radiological Exams on Admission: DG Chest Portable 1 View  Result Date: 06/24/2021 CLINICAL DATA:  Patient feels like he is in AFib. EXAM: PORTABLE CHEST 1 VIEW COMPARISON:  June 14, 2021 FINDINGS: Multiple sternal wires and vascular clips are seen. Decreased lung volumes are noted which is likely secondary to the degree of patient inspiration. Mild atelectasis is suspected within the retrocardiac region of the left lung base. There is no evidence of a pleural effusion or pneumothorax. The cardiac silhouette is mildly enlarged. Degenerative changes seen throughout the thoracic spine. IMPRESSION: Mild cardiomegaly with prior median sternotomy. Electronically Signed   By: Virgina Norfolk M.D.   On: 06/24/2021 00:21     Assessment/Plan 75 year old male with history of diastolic CHF, bioprosthetic AVR 2014, paroxysmal A. fib x1 episode following AVR, not on anticoagulation, OSA, who is s/p elective uncomplicated CABG x3 on Q000111Q, discharged on 7/20, currently on oral antibiotics for UTI who presents to the ED with sudden onset palpitations, similar to prior episode of A. fib , converting to sinus on diltiazem infusion in ER   Atrial fibrillation with rapid ventricular response ([place]) -History of single prior episode of paroxysmal A. fib in 2014 not currently on anticoagulation - Converted to sinus following diltiazem bolus and infusion in the ED - We will start transition to oral diltiazem - Cardiology consult for recommendations on systemic anticoagulation for stroke prevention  CAD  s/p CABG x 3 on 06/13/2021 - First troponin negative,  EKG nonacute and patient denies chest pain - No acute complications suspected at this time - Continue aspirin, atorvastatin.  Will hold Bystolic pending cardiology recommendations  UTI --Cipro '500mg'$   bid to continue from recent hospitalization    Obstructive apnea - CPAP if desired    CHF chronic, diastolic (HCC) - Euvolemic - Continue home meds pending med rec    History of aortic valve replacement with bioprosthetic valve - No acute issues suspected    DVT prophylaxis: Lovenox  Code Status: full code  Family Communication:  none  Disposition Plan: Back to previous home environment Consults called: cardiology  Status:observation    Athena Masse MD [facility]     06/24/2021, 1:43 AM

## 2021-06-24 NOTE — ED Notes (Signed)
Per Dr. Josefa Half, Cardiology, order to give order medications and then stop the diltiazem drip approx 55mns after administration of PO medications.

## 2021-06-24 NOTE — Consult Note (Signed)
[facility] Cardiology  CARDIOLOGY CONSULT NOTE  Patient ID: Adam Zavala MRN: [id number] DOB/AGE: 02-Jun-1946 75 y.o.  Admit date: 06/23/2021 Referring Physician Damita Dunnings Primary Physician Gaynelle Cage Cardiologist Nehemiah Massed Reason for Consultation atrial fibrillation  HPI: 75 year old gentleman referred for evaluation of atrial fibrillation.  Patient has a history of aortic valve replacement and stent circumflex 2014.  She had brief episode of atrial fibrillation following AVR.  Patient has a history of essential hypertension, sleep apnea, and mild chronic diastolic congestive heart failure.  He underwent recent CABG times 37/11/2021 at [facility] with LIMA to LAD, SVG to OM1 and PDA.  Patient was discharged home 06/21/2021, was doing well until last evening 06/23/2021 he awoke with rapid irregular heart rate.  Patient arrived to [facility] emergency room where he was noted to be in atrial fibrillation at a rate of 174 bpm.  The patient was treated with diltiazem bolus and infusion with conversion to sinus rhythm.  The patient currently is clinically and hemodynamically stable, denies chest pain, shortness of breath, or palpitations.  Admission labs notable for normal high sensitive troponin of 15 and 16.  Review of systems complete and found to be negative unless listed above     Past Medical History:  Diagnosis Date   A-fib [facility])    now in normal sinus rhytm   Aortic stenosis    Aortic valve replaced 03/2013   Coronary artery disease    one vessel coronary artery disease, status post DES stent placement at time of aortic valve replacement April 2014. Followed by Dr. Serafina Royals   Dysrhythmia    Elevated PSA    GERD (gastroesophageal reflux disease)    with Barretts esophagus followed by Dr. [name]   Heart attack [facility])    Heart murmur    Hyperlipidemia    Hypertension    Pneumonia    Sleep apnea     Past Surgical History:  Procedure Laterality Date   achilles tendon rupture repair     AORTIC  VALVE SURGERY     CARDIAC VALVE REPLACEMENT     CHOLECYSTECTOMY     COLON SURGERY     COLONOSCOPY W/ BIOPSIES AND POLYPECTOMY     3 polyps removed   COLONOSCOPY WITH PROPOFOL N/A 04/16/2018   Procedure: COLONOSCOPY WITH PROPOFOL;  Surgeon: [name], MD;  Location: [facility] ENDOSCOPY;  Service: Endoscopy;  Laterality: N/A;   EPIDIDYMECTOMY Right 11/12/2016   Procedure: PARTIAL EPIDIDYMECTOMY;  Surgeon: [name], MD;  Location: [facility] ORS;  Service: Urology;  Laterality: Right;   partial removal of colon     PROSTATE BIOPSY     right shoulder surgery     separation/ dislocated   ROTATOR CUFF REPAIR     S/p rectotomy for fissures     SPHINCTEROTOMY     VASECTOMY Right 11/12/2016   Procedure: VASECTOMY;  Surgeon: [name], MD;  Location: [facility] ORS;  Service: Urology;  Laterality: Right;    (Not in a hospital admission)  Social History   Socioeconomic History   Marital status: Married    Spouse name: Not on file   Number of children: Not on file   Years of education: Not on file   Highest education level: Not on file  Occupational History   Not on file  Tobacco Use   Smoking status: Former    Types: Cigars   Smokeless tobacco: Never  Substance and Sexual Activity   Alcohol use: Yes    Alcohol/week: 0.0 standard drinks  Comment: occasional    Drug use: No   Sexual activity: Not on file  Other Topics Concern   Not on file  Social History Narrative   Not on file   Social Determinants of Health   Financial Resource Strain: Not on file  Food Insecurity: Not on file  Transportation Needs: Not on file  Physical Activity: Not on file  Stress: Not on file  Social Connections: Not on file  Intimate Partner Violence: Not on file    Family History  Problem Relation Age of Onset   Bladder Cancer Neg Hx    Prostate cancer Neg Hx    Kidney cancer Neg Hx       Review of systems complete and found to be negative unless listed above      PHYSICAL  EXAM  General: Well developed, well nourished, in no acute distress HEENT:  Normocephalic and atramatic Neck:  No JVD.  Lungs: Clear bilaterally to auscultation and percussion. Heart: HRRR . Normal S1 and S2 without gallops or murmurs.  Abdomen: Bowel sounds are positive, abdomen soft and non-tender  Msk:  Back normal, normal gait. Normal strength and tone for age. Extremities: No clubbing, cyanosis or edema.   Neuro: Alert and oriented X 3. Psych:  Good affect, responds appropriately  Labs:   Lab Results  Component Value Date   WBC 9.5 06/23/2021   HGB 10.4 (L) 06/23/2021   HCT 31.2 (L) 06/23/2021   MCV 98.1 06/23/2021   PLT 471 (H) 06/23/2021    Recent Labs  Lab 06/23/21 2345  NA 134*  K 4.3  CL 101  CO2 24  BUN 20  CREATININE 1.39*  CALCIUM 8.8*  GLUCOSE 133*   Lab Results  Component Value Date   TROPONINI <0.03 08/10/2017   No results found for: CHOL No results found for: HDL No results found for: LDLCALC No results found for: TRIG No results found for: CHOLHDL No results found for: LDLDIRECT    Radiology: DG Chest Portable 1 View  Result Date: 06/24/2021 CLINICAL DATA:  Patient feels like he is in AFib. EXAM: PORTABLE CHEST 1 VIEW COMPARISON:  June 14, 2021 FINDINGS: Multiple sternal wires and vascular clips are seen. Decreased lung volumes are noted which is likely secondary to the degree of patient inspiration. Mild atelectasis is suspected within the retrocardiac region of the left lung base. There is no evidence of a pleural effusion or pneumothorax. The cardiac silhouette is mildly enlarged. Degenerative changes seen throughout the thoracic spine. IMPRESSION: Mild cardiomegaly with prior median sternotomy. Electronically Signed   By: Virgina Norfolk M.D.   On: 06/24/2021 00:21    EKG: Atrial fibrillation at 174 bpm  ASSESSMENT AND PLAN:   1.  Atrial fibrillation with rapid ventricular rate, 10 days postop from coronary artery bypass surgery, converted  to sinus rhythm with diltiazem bolus and drip, with 1 previous episode of atrial fibrillation following aortic valve replacement surgery, with chads Vasc 5.  We discussed in detail, the risk, benefits and alternatives of chronic anticoagulation. 2.  Status post CABG x 3 06/13/2021, in the absence of chest pain, with normal high sensitivity troponin 3.  Essential hypertension 4.  Sleep apnea  Recommendations  1.  Agree with overall current therapy 2.  Resume metoprolol to tartrate, uptitrate to 25 mg twice daily 3.  Start Cardizem CD 180 mg daily 4.  DC Cardizem infusion 45 minutes after patient receives oral medications 5.  Start Eliquis 5 mg twice daily 6.  Ambulate patient later today, if patient does well may discharge home 7.  Follow-up with Dr. Nehemiah Massed next week  Signed: Isaias Cowman MD,PhD, [facility] 06/24/2021, 8:57 AM

## 2021-08-02 ENCOUNTER — Encounter: Payer: Medicare Other | Attending: Internal Medicine | Admitting: *Deleted

## 2021-08-02 ENCOUNTER — Other Ambulatory Visit: Payer: Self-pay

## 2021-08-02 DIAGNOSIS — Z951 Presence of aortocoronary bypass graft: Secondary | ICD-10-CM

## 2021-08-02 NOTE — Progress Notes (Signed)
Virtual orientation call completed today. he has an appointment on Date: 08/15/2021  for EP eval and gym Orientation.  Documentation of diagnosis can be found in [facility]  Date: 06/12/2021 .

## 2021-08-15 ENCOUNTER — Encounter: Payer: Medicare Other | Attending: Internal Medicine

## 2021-08-15 ENCOUNTER — Other Ambulatory Visit: Payer: Self-pay

## 2021-08-15 VITALS — Ht 73.2 in | Wt 243.8 lb

## 2021-08-15 DIAGNOSIS — Z951 Presence of aortocoronary bypass graft: Secondary | ICD-10-CM | POA: Insufficient documentation

## 2021-08-15 NOTE — Progress Notes (Signed)
Cardiac Individual Treatment Plan  Patient Details  Name: Adam Zavala. MRN: [id number] Date of Birth: Sep 03, 1946 Referring Provider:   Flowsheet Row Cardiac Rehab from 08/15/2021 in [facility] Cardiac and Pulmonary Rehab  Referring Provider Serafina Royals MD       Initial Encounter Date:  Flowsheet Row Cardiac Rehab from 08/15/2021 in [facility] Cardiac and Pulmonary Rehab  Date 08/15/21       Visit Diagnosis: S/P CABG x 3 06/13/2021  Patient's Home Medications on Admission:  Current Outpatient Medications:    apixaban (ELIQUIS) 5 MG TABS tablet, Take 1 tablet (5 mg total) by mouth 2 (two) times daily., Disp: 60 tablet, Rfl: 0   apixaban (ELIQUIS) 5 MG TABS tablet, Take 1 tablet by mouth 2 (two) times daily., Disp: , Rfl:    aspirin 81 MG tablet, Take 81 mg by mouth daily., Disp: , Rfl:    atorvastatin (LIPITOR) 80 MG tablet, Take 80 mg by mouth every evening. , Disp: , Rfl:    diltiazem (CARDIZEM CD) 180 MG 24 hr capsule, Take 1 capsule (180 mg total) by mouth daily., Disp: 30 capsule, Rfl: 0   esomeprazole (NEXIUM) 40 MG capsule, Take 40 mg by mouth daily before breakfast., Disp: , Rfl:    metoprolol tartrate (LOPRESSOR) 25 MG tablet, Take 1 tablet (25 mg total) by mouth 2 (two) times daily., Disp: 60 tablet, Rfl: 0   nitroGLYCERIN (NITROSTAT) 0.4 MG SL tablet, Place 0.5 mg under the tongue as directed. (Patient not taking: Reported on 06/24/2021), Disp: , Rfl:    PRALUENT 75 MG/ML SOAJ, SMARTSIG:75 Milligram(s) SUB-Q Every 2 Weeks, Disp: , Rfl:    Tamsulosin HCl (FLOMAX) 0.4 MG CAPS, Take 0.4 mg by mouth daily., Disp: , Rfl:   Past Medical History: Past Medical History:  Diagnosis Date   A-fib ([place])    now in normal sinus rhytm   Aortic stenosis    Aortic valve replaced 03/2013   Coronary artery disease    one vessel coronary artery disease, status post DES stent placement at time of aortic valve replacement April 2014. Followed by Dr. Serafina Royals   Dysrhythmia    Elevated PSA     GERD (gastroesophageal reflux disease)    with Barretts esophagus followed by Dr. [name]   Heart attack [facility])    Heart murmur    Hyperlipidemia    Hypertension    Pneumonia    Sleep apnea     Tobacco Use: Social History   Tobacco Use  Smoking Status Former   Types: Cigars  Smokeless Tobacco Never    Labs: Recent Review Flowsheet Data   There is no flowsheet data to display.      Exercise Target Goals: Exercise Program Goal: Individual exercise prescription set using results from initial 6 min walk test and THRR while considering  patient's activity barriers and safety.   Exercise Prescription Goal: Initial exercise prescription builds to 30-45 minutes a day of aerobic activity, 2-3 days per week.  Home exercise guidelines will be given to patient during program as part of exercise prescription that the participant will acknowledge.   Education: Aerobic Exercise: - Group verbal and visual presentation on the components of exercise prescription. Introduces F.I.T.T principle from ACSM for exercise prescriptions.  Reviews F.I.T.T. principles of aerobic exercise including progression. Written material given at graduation. Flowsheet Row Cardiac Rehab from 08/15/2021 in [facility] Cardiac and Pulmonary Rehab  Education need identified 08/15/21       Education: Resistance Exercise: - Group verbal  and visual presentation on the components of exercise prescription. Introduces F.I.T.T principle from ACSM for exercise prescriptions  Reviews F.I.T.T. principles of resistance exercise including progression. Written material given at graduation.    Education: Exercise & Equipment Safety: - Individual verbal instruction and demonstration of equipment use and safety with use of the equipment. Flowsheet Row Cardiac Rehab from 08/15/2021 in [facility] Cardiac and Pulmonary Rehab  Education need identified 08/15/21  Date 08/15/21  Educator [place]  Instruction Review Code 1- Verbalizes Understanding        Education: Exercise Physiology & General Exercise Guidelines: - Group verbal and written instruction with models to review the exercise physiology of the cardiovascular system and associated critical values. Provides general exercise guidelines with specific guidelines to those with heart or lung disease.    Education: Flexibility, Balance, Mind/Body Relaxation: - Group verbal and visual presentation with interactive activity on the components of exercise prescription. Introduces F.I.T.T principle from ACSM for exercise prescriptions. Reviews F.I.T.T. principles of flexibility and balance exercise training including progression. Also discusses the mind body connection.  Reviews various relaxation techniques to help reduce and manage stress (i.e. Deep breathing, progressive muscle relaxation, and visualization). Balance handout provided to take home. Written material given at graduation.   Activity Barriers & Risk Stratification:  Activity Barriers & Cardiac Risk Stratification - 08/15/21 1356       Activity Barriers & Cardiac Risk Stratification   Activity Barriers None    Cardiac Risk Stratification High             6 Minute Walk:  6 Minute Walk     Row Name 08/15/21 1329         6 Minute Walk   Phase Initial     Distance 1460 feet     Walk Time 6 minutes     # of Rest Breaks 0     MPH 2.76     METS 2.67     RPE 9     Perceived Dyspnea  0     VO2 Peak 9.35     Symptoms No     Resting HR 58 bpm     Resting BP 128/72     Resting Oxygen Saturation  99 %     Exercise Oxygen Saturation  during 6 min walk 97 %     Max Ex. HR 92 bpm     Max Ex. BP 140/72     2 Minute Post BP 130/72              Oxygen Initial Assessment:   Oxygen Re-Evaluation:   Oxygen Discharge (Final Oxygen Re-Evaluation):   Initial Exercise Prescription:  Initial Exercise Prescription - 08/15/21 1400       Date of Initial Exercise RX and Referring Provider   Date 08/15/21     Referring Provider Serafina Royals MD      Treadmill   MPH 2.9    Grade 1    Minutes 15    METs 3.62      NuStep   Level 3    SPM 80    Minutes 15    METs 2.6      REL-XR   Level 2    Speed 50    Minutes 15    METs 2.6      Prescription Details   Frequency (times per week) Hybrid   1x/month in rehab; Home exercise provided   Duration Progress to 30 minutes of continuous aerobic without signs/symptoms of physical  distress      Intensity   THRR 40-80% of Max Heartrate 92-127    Ratings of Perceived Exertion 11-13    Perceived Dyspnea 0-4      Progression   Progression Continue to progress workloads to maintain intensity without signs/symptoms of physical distress.      Resistance Training   Training Prescription Yes    Weight 5 lb    Reps 10-15             Perform Capillary Blood Glucose checks as needed.  Exercise Prescription Changes:   Exercise Prescription Changes     Row Name 08/15/21 1400             Response to Exercise   Blood Pressure (Admit) 128/72       Blood Pressure (Exercise) 140/72       Blood Pressure (Exit) 130/72       Heart Rate (Admit) 58 bpm       Heart Rate (Exercise) 92 bpm       Heart Rate (Exit) 63 bpm       Oxygen Saturation (Admit) 99 %       Oxygen Saturation (Exercise) 97 %       Oxygen Saturation (Exit) 99 %       Rating of Perceived Exertion (Exercise) 9       Perceived Dyspnea (Exercise) 0       Symptoms none       Comments walk test results                Exercise Comments:   Exercise Goals and Review:   Exercise Goals     Row Name 08/15/21 1405             Exercise Goals   Increase Physical Activity Yes       Intervention Provide advice, education, support and counseling about physical activity/exercise needs.;Develop an individualized exercise prescription for aerobic and resistive training based on initial evaluation findings, risk stratification, comorbidities and participant's personal  goals.       Expected Outcomes Short Term: Attend rehab on a regular basis to increase amount of physical activity.;Long Term: Add in home exercise to make exercise part of routine and to increase amount of physical activity.;Long Term: Exercising regularly at least 3-5 days a week.       Increase Strength and Stamina Yes       Intervention Develop an individualized exercise prescription for aerobic and resistive training based on initial evaluation findings, risk stratification, comorbidities and participant's personal goals.;Provide advice, education, support and counseling about physical activity/exercise needs.       Expected Outcomes Short Term: Increase workloads from initial exercise prescription for resistance, speed, and METs.;Short Term: Perform resistance training exercises routinely during rehab and add in resistance training at home;Long Term: Improve cardiorespiratory fitness, muscular endurance and strength as measured by increased METs and functional capacity (6MWT)       Able to understand and use rate of perceived exertion (RPE) scale Yes       Intervention Provide education and explanation on how to use RPE scale       Expected Outcomes Short Term: Able to use RPE daily in rehab to express subjective intensity level;Long Term:  Able to use RPE to guide intensity level when exercising independently       Able to understand and use Dyspnea scale Yes       Intervention Provide education and explanation on how to use  Dyspnea scale       Expected Outcomes Short Term: Able to use Dyspnea scale daily in rehab to express subjective sense of shortness of breath during exertion;Long Term: Able to use Dyspnea scale to guide intensity level when exercising independently       Knowledge and understanding of Target Heart Rate Range (THRR) Yes       Intervention Provide education and explanation of THRR including how the numbers were predicted and where they are located for reference       Expected  Outcomes Long Term: Able to use THRR to govern intensity when exercising independently;Short Term: Able to state/look up THRR;Short Term: Able to use daily as guideline for intensity in rehab       Able to check pulse independently Yes       Intervention Review the importance of being able to check your own pulse for safety during independent exercise;Provide education and demonstration on how to check pulse in carotid and radial arteries.       Expected Outcomes Short Term: Able to explain why pulse checking is important during independent exercise;Long Term: Able to check pulse independently and accurately       Understanding of Exercise Prescription Yes       Intervention Provide education, explanation, and written materials on patient's individual exercise prescription       Expected Outcomes Short Term: Able to explain program exercise prescription;Long Term: Able to explain home exercise prescription to exercise independently                Exercise Goals Re-Evaluation :   Discharge Exercise Prescription (Final Exercise Prescription Changes):  Exercise Prescription Changes - 08/15/21 1400       Response to Exercise   Blood Pressure (Admit) 128/72    Blood Pressure (Exercise) 140/72    Blood Pressure (Exit) 130/72    Heart Rate (Admit) 58 bpm    Heart Rate (Exercise) 92 bpm    Heart Rate (Exit) 63 bpm    Oxygen Saturation (Admit) 99 %    Oxygen Saturation (Exercise) 97 %    Oxygen Saturation (Exit) 99 %    Rating of Perceived Exertion (Exercise) 9    Perceived Dyspnea (Exercise) 0    Symptoms none    Comments walk test results             Nutrition:  Target Goals: Understanding of nutrition guidelines, daily intake of sodium 1500mg , cholesterol 200mg , calories 30% from fat and 7% or less from saturated fats, daily to have 5 or more servings of fruits and vegetables.  Education: All About Nutrition: -Group instruction provided by verbal, written material,  interactive activities, discussions, models, and posters to present general guidelines for heart healthy nutrition including fat, fiber, MyPlate, the role of sodium in heart healthy nutrition, utilization of the nutrition label, and utilization of this knowledge for meal planning. Follow up email sent as well. Written material given at graduation. Flowsheet Row Cardiac Rehab from 08/15/2021 in [facility] Cardiac and Pulmonary Rehab  Education need identified 08/15/21       Biometrics:  Pre Biometrics - 08/15/21 1356       Pre Biometrics   Height 6' 1.2" (1.859 m)    Weight 243 lb 12.8 oz (110.6 kg)    BMI (Calculated) 32    Single Leg Stand 30 seconds              Nutrition Therapy Plan and Nutrition Goals:   Nutrition Assessments:  MEDIFICTS Score Key: ?70 Need to make dietary changes  40-70 Heart Healthy Diet ? 40 Therapeutic Level Cholesterol Diet  Flowsheet Row Cardiac Rehab from 08/15/2021 in [facility] Cardiac and Pulmonary Rehab  Picture Your Plate Total Score on Admission 83      Picture Your Plate Scores: <25 Unhealthy dietary pattern with much room for improvement. 41-50 Dietary pattern unlikely to meet recommendations for good health and room for improvement. 51-60 More healthful dietary pattern, with some room for improvement.  >60 Healthy dietary pattern, although there may be some specific behaviors that could be improved.    Nutrition Goals Re-Evaluation:   Nutrition Goals Discharge (Final Nutrition Goals Re-Evaluation):   Psychosocial: Target Goals: Acknowledge presence or absence of significant depression and/or stress, maximize coping skills, provide positive support system. Participant is able to verbalize types and ability to use techniques and skills needed for reducing stress and depression.   Education: Stress, Anxiety, and Depression - Group verbal and visual presentation to define topics covered.  Reviews how body is impacted by stress, anxiety, and  depression.  Also discusses healthy ways to reduce stress and to treat/manage anxiety and depression.  Written material given at graduation.   Education: Sleep Hygiene -Provides group verbal and written instruction about how sleep can affect your health.  Define sleep hygiene, discuss sleep cycles and impact of sleep habits. Review good sleep hygiene tips.    Initial Review & Psychosocial Screening:  Initial Psych Review & Screening - 08/02/21 1039       Initial Review   Current issues with None Identified      Family Dynamics   Good Support System? Yes   wife, grandchildren,     Barriers   Psychosocial barriers to participate in program There are no identifiable barriers or psychosocial needs.      Screening Interventions   Interventions Encouraged to exercise    Expected Outcomes Short Term goal: Utilizing psychosocial counselor, staff and physician to assist with identification of specific Stressors or current issues interfering with healing process. Setting desired goal for each stressor or current issue identified.;Long Term Goal: Stressors or current issues are controlled or eliminated.;Short Term goal: Identification and review with participant of any Quality of Life or Depression concerns found by scoring the questionnaire.;Long Term goal: The participant improves quality of Life and PHQ9 Scores as seen by post scores and/or verbalization of changes             Quality of Life Scores:   Quality of Life - 08/15/21 1327       Quality of Life   Select Quality of Life      Quality of Life Scores   Health/Function Pre 29.57 %    Socioeconomic Pre 29.17 %    Psych/Spiritual Pre 29.29 %    Family Pre 28.75 %    GLOBAL Pre 29.32 %            Scores of 19 and below usually indicate a poorer quality of life in these areas.  A difference of  2-3 points is a clinically meaningful difference.  A difference of 2-3 points in the total score of the Quality of Life Index has  been associated with significant improvement in overall quality of life, self-image, physical symptoms, and general health in studies assessing change in quality of life.  PHQ-9: Recent Review Flowsheet Data     Depression screen [facility] 2/9 08/15/2021   Decreased Interest 0   Down, Depressed, Hopeless 0  PHQ - 2 Score 0   Altered sleeping 0   Tired, decreased energy 1   Change in appetite 0   Feeling bad or failure about yourself  0   Trouble concentrating 0   Moving slowly or fidgety/restless 0   Suicidal thoughts 0   PHQ-9 Score 1      Interpretation of Total Score  Total Score Depression Severity:  1-4 = Minimal depression, 5-9 = Mild depression, 10-14 = Moderate depression, 15-19 = Moderately severe depression, 20-27 = Severe depression   Psychosocial Evaluation and Intervention:  Psychosocial Evaluation - 08/02/21 1048       Psychosocial Evaluation & Interventions   Interventions Encouraged to exercise with the program and follow exercise prescription    Comments Maynard has no barriers to attending the program. He is retired and continues to [employer] with his company. He travels frequesntly  and stays active. daily walks, to hunting and other activities. His wife and family and the employees of his company are his support team.  He expresses no stress and he is thankful for every day he sees the sun.  He will work on the program as a hybrid participnat,and will review the session schedule with the EP during his Exercise evaluation and gym orientation.    Expected Outcomes STG: Tylar will continue to grow with his knowledge of cardiovascular disease and how to manage his risk factors.  LTG: Desiree will continue to progress his exercise and work on keeping his risk factors under control.    Continue Psychosocial Services  Follow up required by staff             Psychosocial Re-Evaluation:   Psychosocial Discharge (Final Psychosocial Re-Evaluation):   Vocational  Rehabilitation: Provide vocational rehab assistance to qualifying candidates.   Vocational Rehab Evaluation & Intervention:   Education: Education Goals: Education classes will be provided on a variety of topics geared toward better understanding of heart health and risk factor modification. Participant will state understanding/return demonstration of topics presented as noted by education test scores.  Learning Barriers/Preferences:   General Cardiac Education Topics:  AED/CPR: - Group verbal and written instruction with the use of models to demonstrate the basic use of the AED with the basic ABC's of resuscitation.   Anatomy and Cardiac Procedures: - Group verbal and visual presentation and models provide information about basic cardiac anatomy and function. Reviews the testing methods done to diagnose heart disease and the outcomes of the test results. Describes the treatment choices: Medical Management, Angioplasty, or Coronary Bypass Surgery for treating various heart conditions including Myocardial Infarction, Angina, Valve Disease, and Cardiac Arrhythmias.  Written material given at graduation.   Medication Safety: - Group verbal and visual instruction to review commonly prescribed medications for heart and lung disease. Reviews the medication, class of the drug, and side effects. Includes the steps to properly store meds and maintain the prescription regimen.  Written material given at graduation.   Intimacy: - Group verbal instruction through game format to discuss how heart and lung disease can affect sexual intimacy. Written material given at graduation..   Know Your Numbers and Heart Failure: - Group verbal and visual instruction to discuss disease risk factors for cardiac and pulmonary disease and treatment options.  Reviews associated critical values for Overweight/Obesity, Hypertension, Cholesterol, and Diabetes.  Discusses basics of heart failure: signs/symptoms and  treatments.  Introduces Heart Failure Zone chart for action plan for heart failure.  Written material given at graduation.   Infection Prevention: -  Provides verbal and written material to individual with discussion of infection control including proper hand washing and proper equipment cleaning during exercise session. Flowsheet Row Cardiac Rehab from 08/15/2021 in [facility] Cardiac and Pulmonary Rehab  Education need identified 08/15/21  Date 08/15/21  Educator [place]  Instruction Review Code 1- Verbalizes Understanding       Falls Prevention: - Provides verbal and written material to individual with discussion of falls prevention and safety. Flowsheet Row Cardiac Rehab from 08/15/2021 in [facility] Cardiac and Pulmonary Rehab  Education need identified 08/15/21  Date 08/15/21  Educator [place]  Instruction Review Code 1- Verbalizes Understanding       Other: -Provides group and verbal instruction on various topics (see comments)   Knowledge Questionnaire Score:  Knowledge Questionnaire Score - 08/15/21 1328       Knowledge Questionnaire Score   Pre Score 24/26: PAD, Nutrition             Core Components/Risk Factors/Patient Goals at Admission:  Personal Goals and Risk Factors at Admission - 08/15/21 1418       Core Components/Risk Factors/Patient Goals on Admission    Weight Management Yes;Weight Loss    Intervention Weight Management: Develop a combined nutrition and exercise program designed to reach desired caloric intake, while maintaining appropriate intake of nutrient and fiber, sodium and fats, and appropriate energy expenditure required for the weight goal.;Weight Management: Provide education and appropriate resources to help participant work on and attain dietary goals.;Weight Management/Obesity: Establish reasonable short term and long term weight goals.    Admit Weight 243 lb (110.2 kg)    Goal Weight: Short Term 239 lb (108.4 kg)    Goal Weight: Long Term 233 lb (105.7  kg)    Expected Outcomes Short Term: Continue to assess and modify interventions until short term weight is achieved;Weight Maintenance: Understanding of the daily nutrition guidelines, which includes 25-35% calories from fat, 7% or less cal from saturated fats, less than 200mg  cholesterol, less than 1.5gm of sodium, & 5 or more servings of fruits and vegetables daily;Weight Loss: Understanding of general recommendations for a balanced deficit meal plan, which promotes 1-2 lb weight loss per week and includes a negative energy balance of [phone] kcal/d;Understanding recommendations for meals to include 15-35% energy as protein, 25-35% energy from fat, 35-60% energy from carbohydrates, less than 200mg  of dietary cholesterol, 20-35 gm of total fiber daily;Understanding of distribution of calorie intake throughout the day with the consumption of 4-5 meals/snacks    Hypertension Yes    Intervention Provide education on lifestyle modifcations including regular physical activity/exercise, weight management, moderate sodium restriction and increased consumption of fresh fruit, vegetables, and low fat dairy, alcohol moderation, and smoking cessation.;Monitor prescription use compliance.    Expected Outcomes Short Term: Continued assessment and intervention until BP is < 140/17mm HG in hypertensive participants. < 130/53mm HG in hypertensive participants with diabetes, heart failure or chronic kidney disease.;Long Term: Maintenance of blood pressure at goal levels.    Lipids Yes    Intervention Provide education and support for participant on nutrition & aerobic/resistive exercise along with prescribed medications to achieve LDL 70mg , HDL >40mg .    Expected Outcomes Short Term: Participant states understanding of desired cholesterol values and is compliant with medications prescribed. Participant is following exercise prescription and nutrition guidelines.;Long Term: Cholesterol controlled with medications as  prescribed, with individualized exercise RX and with personalized nutrition plan. Value goals: LDL < 70mg , HDL > 40 mg.  Education:Diabetes - Individual verbal and written instruction to review signs/symptoms of diabetes, desired ranges of glucose level fasting, after meals and with exercise. Acknowledge that pre and post exercise glucose checks will be done for 3 sessions at entry of program.   Core Components/Risk Factors/Patient Goals Review:    Core Components/Risk Factors/Patient Goals at Discharge (Final Review):    ITP Comments:  ITP Comments     Row Name 08/02/21 1056 08/15/21 1210         ITP Comments Virtual orientation call completed today. he has an appointment on Date: 08/15/2021  for EP eval and gym Orientation.  Documentation of diagnosis can be found in [facility] [facility]  Date: 06/12/2021 . Completed 6MWT and gym orientation. Initial ITP created and sent for review toDr. Emily Filbert, Medical Director. Patient will be participating in the hybrid program. Will attend to rehab in person 1x/month.               Comments: Initial ITP

## 2021-08-15 NOTE — Patient Instructions (Signed)
Patient Instructions  Patient Details  Name: Adam Zavala. MRN: [id number] Date of Birth: Apr 06, 1946 Referring Provider:  Corey Skains, MD  Below are your personal goals for exercise, nutrition, and risk factors. Our goal is to help you stay on track towards obtaining and maintaining these goals. We will be discussing your progress on these goals with you throughout the program.  Initial Exercise Prescription:  Initial Exercise Prescription - 08/15/21 1400       Date of Initial Exercise RX and Referring Provider   Date 08/15/21    Referring Provider Serafina Royals MD      Treadmill   MPH 2.9    Grade 1    Minutes 15    METs 3.62      NuStep   Level 3    SPM 80    Minutes 15    METs 2.6      REL-XR   Level 2    Speed 50    Minutes 15    METs 2.6      Prescription Details   Frequency (times per week) Hybrid   1x/month in rehab; Home exercise provided   Duration Progress to 30 minutes of continuous aerobic without signs/symptoms of physical distress      Intensity   THRR 40-80% of Max Heartrate 92-127    Ratings of Perceived Exertion 11-13    Perceived Dyspnea 0-4      Progression   Progression Continue to progress workloads to maintain intensity without signs/symptoms of physical distress.      Resistance Training   Training Prescription Yes    Weight 5 lb    Reps 10-15             Exercise Goals: Frequency: Be able to perform aerobic exercise two to three times per week in program working toward 2-5 days per week of home exercise.  Intensity: Work with a perceived exertion of 11 (fairly light) - 15 (hard) while following your exercise prescription.  We will make changes to your prescription with you as you progress through the program.   Duration: Be able to do 30 to 45 minutes of continuous aerobic exercise in addition to a 5 minute warm-up and a 5 minute cool-down routine.   Nutrition Goals: Your personal nutrition goals will be established  when you do your nutrition analysis with the dietician.  The following are general nutrition guidelines to follow: Cholesterol < '200mg'$ /day Sodium < '1500mg'$ /day Fiber: Men over 50 yrs - 30 grams per day  Personal Goals:  Personal Goals and Risk Factors at Admission - 08/15/21 1418       Core Components/Risk Factors/Patient Goals on Admission    Weight Management Yes;Weight Loss    Intervention Weight Management: Develop a combined nutrition and exercise program designed to reach desired caloric intake, while maintaining appropriate intake of nutrient and fiber, sodium and fats, and appropriate energy expenditure required for the weight goal.;Weight Management: Provide education and appropriate resources to help participant work on and attain dietary goals.;Weight Management/Obesity: Establish reasonable short term and long term weight goals.    Admit Weight 243 lb (110.2 kg)    Goal Weight: Short Term 239 lb (108.4 kg)    Goal Weight: Long Term 233 lb (105.7 kg)    Expected Outcomes Short Term: Continue to assess and modify interventions until short term weight is achieved;Weight Maintenance: Understanding of the daily nutrition guidelines, which includes 25-35% calories from fat, 7% or less cal from saturated fats,  less than '200mg'$  cholesterol, less than 1.5gm of sodium, & 5 or more servings of fruits and vegetables daily;Weight Loss: Understanding of general recommendations for a balanced deficit meal plan, which promotes 1-2 lb weight loss per week and includes a negative energy balance of [phone] kcal/d;Understanding recommendations for meals to include 15-35% energy as protein, 25-35% energy from fat, 35-60% energy from carbohydrates, less than '200mg'$  of dietary cholesterol, 20-35 gm of total fiber daily;Understanding of distribution of calorie intake throughout the day with the consumption of 4-5 meals/snacks    Hypertension Yes    Intervention Provide education on lifestyle modifcations  including regular physical activity/exercise, weight management, moderate sodium restriction and increased consumption of fresh fruit, vegetables, and low fat dairy, alcohol moderation, and smoking cessation.;Monitor prescription use compliance.    Expected Outcomes Short Term: Continued assessment and intervention until BP is < 140/15m HG in hypertensive participants. < 130/880mHG in hypertensive participants with diabetes, heart failure or chronic kidney disease.;Long Term: Maintenance of blood pressure at goal levels.    Lipids Yes    Intervention Provide education and support for participant on nutrition & aerobic/resistive exercise along with prescribed medications to achieve LDL '70mg'$ , HDL >'40mg'$ .    Expected Outcomes Short Term: Participant states understanding of desired cholesterol values and is compliant with medications prescribed. Participant is following exercise prescription and nutrition guidelines.;Long Term: Cholesterol controlled with medications as prescribed, with individualized exercise RX and with personalized nutrition plan. Value goals: LDL < '70mg'$ , HDL > 40 mg.             Tobacco Use Initial Evaluation: Social History   Tobacco Use  Smoking Status Former   Types: Cigars  Smokeless Tobacco Never    Exercise Goals and Review:  Exercise Goals     Row Name 08/15/21 1405             Exercise Goals   Increase Physical Activity Yes       Intervention Provide advice, education, support and counseling about physical activity/exercise needs.;Develop an individualized exercise prescription for aerobic and resistive training based on initial evaluation findings, risk stratification, comorbidities and participant's personal goals.       Expected Outcomes Short Term: Attend rehab on a regular basis to increase amount of physical activity.;Long Term: Add in home exercise to make exercise part of routine and to increase amount of physical activity.;Long Term: Exercising  regularly at least 3-5 days a week.       Increase Strength and Stamina Yes       Intervention Develop an individualized exercise prescription for aerobic and resistive training based on initial evaluation findings, risk stratification, comorbidities and participant's personal goals.;Provide advice, education, support and counseling about physical activity/exercise needs.       Expected Outcomes Short Term: Increase workloads from initial exercise prescription for resistance, speed, and METs.;Short Term: Perform resistance training exercises routinely during rehab and add in resistance training at home;Long Term: Improve cardiorespiratory fitness, muscular endurance and strength as measured by increased METs and functional capacity (6MWT)       Able to understand and use rate of perceived exertion (RPE) scale Yes       Intervention Provide education and explanation on how to use RPE scale       Expected Outcomes Short Term: Able to use RPE daily in rehab to express subjective intensity level;Long Term:  Able to use RPE to guide intensity level when exercising independently       Able to understand and  use Dyspnea scale Yes       Intervention Provide education and explanation on how to use Dyspnea scale       Expected Outcomes Short Term: Able to use Dyspnea scale daily in rehab to express subjective sense of shortness of breath during exertion;Long Term: Able to use Dyspnea scale to guide intensity level when exercising independently       Knowledge and understanding of Target Heart Rate Range (THRR) Yes       Intervention Provide education and explanation of THRR including how the numbers were predicted and where they are located for reference       Expected Outcomes Long Term: Able to use THRR to govern intensity when exercising independently;Short Term: Able to state/look up THRR;Short Term: Able to use daily as guideline for intensity in rehab       Able to check pulse independently Yes        Intervention Review the importance of being able to check your own pulse for safety during independent exercise;Provide education and demonstration on how to check pulse in carotid and radial arteries.       Expected Outcomes Short Term: Able to explain why pulse checking is important during independent exercise;Long Term: Able to check pulse independently and accurately       Understanding of Exercise Prescription Yes       Intervention Provide education, explanation, and written materials on patient's individual exercise prescription       Expected Outcomes Short Term: Able to explain program exercise prescription;Long Term: Able to explain home exercise prescription to exercise independently                Copy of goals given to participant.

## 2021-08-21 ENCOUNTER — Other Ambulatory Visit: Payer: Self-pay

## 2021-08-21 DIAGNOSIS — Z951 Presence of aortocoronary bypass graft: Secondary | ICD-10-CM

## 2021-08-21 NOTE — Progress Notes (Signed)
First follow up email for patient completed and sent today for patient's cardiac rehab hybrid program. EP sent patient home exercise plan instructions including THRZ, exercise restrictions, symptoms to monitor, and how to send an exercise log going forward. EP sent [facility] Youtube account as well as RPE & Dyspnea scales. Encouraged patient to ask questions. Will follow up weekly with home exercise.

## 2021-09-06 ENCOUNTER — Encounter: Payer: Self-pay | Admitting: *Deleted

## 2021-09-06 DIAGNOSIS — Z951 Presence of aortocoronary bypass graft: Secondary | ICD-10-CM

## 2021-09-06 NOTE — Progress Notes (Signed)
Cardiac Individual Treatment Plan  Patient Details  Name: Adam Zavala. MRN: [id number] Date of Birth: Sep 03, 1946 Referring Provider:   Flowsheet Row Cardiac Rehab from 08/15/2021 in [facility] Cardiac and Pulmonary Rehab  Referring Provider Serafina Royals MD       Initial Encounter Date:  Flowsheet Row Cardiac Rehab from 08/15/2021 in [facility] Cardiac and Pulmonary Rehab  Date 08/15/21       Visit Diagnosis: S/P CABG x 3 06/13/2021  Patient's Home Medications on Admission:  Current Outpatient Medications:    apixaban (ELIQUIS) 5 MG TABS tablet, Take 1 tablet (5 mg total) by mouth 2 (two) times daily., Disp: 60 tablet, Rfl: 0   apixaban (ELIQUIS) 5 MG TABS tablet, Take 1 tablet by mouth 2 (two) times daily., Disp: , Rfl:    aspirin 81 MG tablet, Take 81 mg by mouth daily., Disp: , Rfl:    atorvastatin (LIPITOR) 80 MG tablet, Take 80 mg by mouth every evening. , Disp: , Rfl:    diltiazem (CARDIZEM CD) 180 MG 24 hr capsule, Take 1 capsule (180 mg total) by mouth daily., Disp: 30 capsule, Rfl: 0   esomeprazole (NEXIUM) 40 MG capsule, Take 40 mg by mouth daily before breakfast., Disp: , Rfl:    metoprolol tartrate (LOPRESSOR) 25 MG tablet, Take 1 tablet (25 mg total) by mouth 2 (two) times daily., Disp: 60 tablet, Rfl: 0   nitroGLYCERIN (NITROSTAT) 0.4 MG SL tablet, Place 0.5 mg under the tongue as directed. (Patient not taking: Reported on 06/24/2021), Disp: , Rfl:    PRALUENT 75 MG/ML SOAJ, SMARTSIG:75 Milligram(s) SUB-Q Every 2 Weeks, Disp: , Rfl:    Tamsulosin HCl (FLOMAX) 0.4 MG CAPS, Take 0.4 mg by mouth daily., Disp: , Rfl:   Past Medical History: Past Medical History:  Diagnosis Date   A-fib ([place])    now in normal sinus rhytm   Aortic stenosis    Aortic valve replaced 03/2013   Coronary artery disease    one vessel coronary artery disease, status post DES stent placement at time of aortic valve replacement April 2014. Followed by Dr. Serafina Royals   Dysrhythmia    Elevated PSA     GERD (gastroesophageal reflux disease)    with Barretts esophagus followed by Dr. [name]   Heart attack [facility])    Heart murmur    Hyperlipidemia    Hypertension    Pneumonia    Sleep apnea     Tobacco Use: Social History   Tobacco Use  Smoking Status Former   Types: Cigars  Smokeless Tobacco Never    Labs: Recent Review Flowsheet Data   There is no flowsheet data to display.      Exercise Target Goals: Exercise Program Goal: Individual exercise prescription set using results from initial 6 min walk test and THRR while considering  patient's activity barriers and safety.   Exercise Prescription Goal: Initial exercise prescription builds to 30-45 minutes a day of aerobic activity, 2-3 days per week.  Home exercise guidelines will be given to patient during program as part of exercise prescription that the participant will acknowledge.   Education: Aerobic Exercise: - Group verbal and visual presentation on the components of exercise prescription. Introduces F.I.T.T principle from ACSM for exercise prescriptions.  Reviews F.I.T.T. principles of aerobic exercise including progression. Written material given at graduation. Flowsheet Row Cardiac Rehab from 08/15/2021 in [facility] Cardiac and Pulmonary Rehab  Education need identified 08/15/21       Education: Resistance Exercise: - Group verbal  and visual presentation on the components of exercise prescription. Introduces F.I.T.T principle from ACSM for exercise prescriptions  Reviews F.I.T.T. principles of resistance exercise including progression. Written material given at graduation.    Education: Exercise & Equipment Safety: - Individual verbal instruction and demonstration of equipment use and safety with use of the equipment. Flowsheet Row Cardiac Rehab from 08/15/2021 in [facility] Cardiac and Pulmonary Rehab  Education need identified 08/15/21  Date 08/15/21  Educator [place]  Instruction Review Code 1- Verbalizes Understanding        Education: Exercise Physiology & General Exercise Guidelines: - Group verbal and written instruction with models to review the exercise physiology of the cardiovascular system and associated critical values. Provides general exercise guidelines with specific guidelines to those with heart or lung disease.    Education: Flexibility, Balance, Mind/Body Relaxation: - Group verbal and visual presentation with interactive activity on the components of exercise prescription. Introduces F.I.T.T principle from ACSM for exercise prescriptions. Reviews F.I.T.T. principles of flexibility and balance exercise training including progression. Also discusses the mind body connection.  Reviews various relaxation techniques to help reduce and manage stress (i.e. Deep breathing, progressive muscle relaxation, and visualization). Balance handout provided to take home. Written material given at graduation.   Activity Barriers & Risk Stratification:  Activity Barriers & Cardiac Risk Stratification - 08/15/21 1356       Activity Barriers & Cardiac Risk Stratification   Activity Barriers None    Cardiac Risk Stratification High             6 Minute Walk:  6 Minute Walk     Row Name 08/15/21 1329         6 Minute Walk   Phase Initial     Distance 1460 feet     Walk Time 6 minutes     # of Rest Breaks 0     MPH 2.76     METS 2.67     RPE 9     Perceived Dyspnea  0     VO2 Peak 9.35     Symptoms No     Resting HR 58 bpm     Resting BP 128/72     Resting Oxygen Saturation  99 %     Exercise Oxygen Saturation  during 6 min walk 97 %     Max Ex. HR 92 bpm     Max Ex. BP 140/72     2 Minute Post BP 130/72              Oxygen Initial Assessment:   Oxygen Re-Evaluation:   Oxygen Discharge (Final Oxygen Re-Evaluation):   Initial Exercise Prescription:  Initial Exercise Prescription - 08/15/21 1400       Date of Initial Exercise RX and Referring Provider   Date 08/15/21     Referring Provider Serafina Royals MD      Treadmill   MPH 2.9    Grade 1    Minutes 15    METs 3.62      NuStep   Level 3    SPM 80    Minutes 15    METs 2.6      REL-XR   Level 2    Speed 50    Minutes 15    METs 2.6      Prescription Details   Frequency (times per week) Hybrid   1x/month in rehab; Home exercise provided   Duration Progress to 30 minutes of continuous aerobic without signs/symptoms of physical  distress      Intensity   THRR 40-80% of Max Heartrate 92-127    Ratings of Perceived Exertion 11-13    Perceived Dyspnea 0-4      Progression   Progression Continue to progress workloads to maintain intensity without signs/symptoms of physical distress.      Resistance Training   Training Prescription Yes    Weight 5 lb    Reps 10-15             Perform Capillary Blood Glucose checks as needed.  Exercise Prescription Changes:   Exercise Prescription Changes     Row Name 08/15/21 1400             Response to Exercise   Blood Pressure (Admit) 128/72       Blood Pressure (Exercise) 140/72       Blood Pressure (Exit) 130/72       Heart Rate (Admit) 58 bpm       Heart Rate (Exercise) 92 bpm       Heart Rate (Exit) 63 bpm       Oxygen Saturation (Admit) 99 %       Oxygen Saturation (Exercise) 97 %       Oxygen Saturation (Exit) 99 %       Rating of Perceived Exertion (Exercise) 9       Perceived Dyspnea (Exercise) 0       Symptoms none       Comments walk test results                Exercise Comments:   Exercise Goals and Review:   Exercise Goals     Row Name 08/15/21 1405             Exercise Goals   Increase Physical Activity Yes       Intervention Provide advice, education, support and counseling about physical activity/exercise needs.;Develop an individualized exercise prescription for aerobic and resistive training based on initial evaluation findings, risk stratification, comorbidities and participant's personal  goals.       Expected Outcomes Short Term: Attend rehab on a regular basis to increase amount of physical activity.;Long Term: Add in home exercise to make exercise part of routine and to increase amount of physical activity.;Long Term: Exercising regularly at least 3-5 days a week.       Increase Strength and Stamina Yes       Intervention Develop an individualized exercise prescription for aerobic and resistive training based on initial evaluation findings, risk stratification, comorbidities and participant's personal goals.;Provide advice, education, support and counseling about physical activity/exercise needs.       Expected Outcomes Short Term: Increase workloads from initial exercise prescription for resistance, speed, and METs.;Short Term: Perform resistance training exercises routinely during rehab and add in resistance training at home;Long Term: Improve cardiorespiratory fitness, muscular endurance and strength as measured by increased METs and functional capacity (6MWT)       Able to understand and use rate of perceived exertion (RPE) scale Yes       Intervention Provide education and explanation on how to use RPE scale       Expected Outcomes Short Term: Able to use RPE daily in rehab to express subjective intensity level;Long Term:  Able to use RPE to guide intensity level when exercising independently       Able to understand and use Dyspnea scale Yes       Intervention Provide education and explanation on how to use  Dyspnea scale       Expected Outcomes Short Term: Able to use Dyspnea scale daily in rehab to express subjective sense of shortness of breath during exertion;Long Term: Able to use Dyspnea scale to guide intensity level when exercising independently       Knowledge and understanding of Target Heart Rate Range (THRR) Yes       Intervention Provide education and explanation of THRR including how the numbers were predicted and where they are located for reference       Expected  Outcomes Long Term: Able to use THRR to govern intensity when exercising independently;Short Term: Able to state/look up THRR;Short Term: Able to use daily as guideline for intensity in rehab       Able to check pulse independently Yes       Intervention Review the importance of being able to check your own pulse for safety during independent exercise;Provide education and demonstration on how to check pulse in carotid and radial arteries.       Expected Outcomes Short Term: Able to explain why pulse checking is important during independent exercise;Long Term: Able to check pulse independently and accurately       Understanding of Exercise Prescription Yes       Intervention Provide education, explanation, and written materials on patient's individual exercise prescription       Expected Outcomes Short Term: Able to explain program exercise prescription;Long Term: Able to explain home exercise prescription to exercise independently                Exercise Goals Re-Evaluation :   Discharge Exercise Prescription (Final Exercise Prescription Changes):  Exercise Prescription Changes - 08/15/21 1400       Response to Exercise   Blood Pressure (Admit) 128/72    Blood Pressure (Exercise) 140/72    Blood Pressure (Exit) 130/72    Heart Rate (Admit) 58 bpm    Heart Rate (Exercise) 92 bpm    Heart Rate (Exit) 63 bpm    Oxygen Saturation (Admit) 99 %    Oxygen Saturation (Exercise) 97 %    Oxygen Saturation (Exit) 99 %    Rating of Perceived Exertion (Exercise) 9    Perceived Dyspnea (Exercise) 0    Symptoms none    Comments walk test results             Nutrition:  Target Goals: Understanding of nutrition guidelines, daily intake of sodium 1500mg , cholesterol 200mg , calories 30% from fat and 7% or less from saturated fats, daily to have 5 or more servings of fruits and vegetables.  Education: All About Nutrition: -Group instruction provided by verbal, written material,  interactive activities, discussions, models, and posters to present general guidelines for heart healthy nutrition including fat, fiber, MyPlate, the role of sodium in heart healthy nutrition, utilization of the nutrition label, and utilization of this knowledge for meal planning. Follow up email sent as well. Written material given at graduation. Flowsheet Row Cardiac Rehab from 08/15/2021 in [facility] Cardiac and Pulmonary Rehab  Education need identified 08/15/21       Biometrics:  Pre Biometrics - 08/15/21 1356       Pre Biometrics   Height 6' 1.2" (1.859 m)    Weight 243 lb 12.8 oz (110.6 kg)    BMI (Calculated) 32    Single Leg Stand 30 seconds              Nutrition Therapy Plan and Nutrition Goals:   Nutrition Assessments:  MEDIFICTS Score Key: ?70 Need to make dietary changes  40-70 Heart Healthy Diet ? 40 Therapeutic Level Cholesterol Diet  Flowsheet Row Cardiac Rehab from 08/15/2021 in [facility] Cardiac and Pulmonary Rehab  Picture Your Plate Total Score on Admission 83      Picture Your Plate Scores: <25 Unhealthy dietary pattern with much room for improvement. 41-50 Dietary pattern unlikely to meet recommendations for good health and room for improvement. 51-60 More healthful dietary pattern, with some room for improvement.  >60 Healthy dietary pattern, although there may be some specific behaviors that could be improved.    Nutrition Goals Re-Evaluation:   Nutrition Goals Discharge (Final Nutrition Goals Re-Evaluation):   Psychosocial: Target Goals: Acknowledge presence or absence of significant depression and/or stress, maximize coping skills, provide positive support system. Participant is able to verbalize types and ability to use techniques and skills needed for reducing stress and depression.   Education: Stress, Anxiety, and Depression - Group verbal and visual presentation to define topics covered.  Reviews how body is impacted by stress, anxiety, and  depression.  Also discusses healthy ways to reduce stress and to treat/manage anxiety and depression.  Written material given at graduation.   Education: Sleep Hygiene -Provides group verbal and written instruction about how sleep can affect your health.  Define sleep hygiene, discuss sleep cycles and impact of sleep habits. Review good sleep hygiene tips.    Initial Review & Psychosocial Screening:  Initial Psych Review & Screening - 08/02/21 1039       Initial Review   Current issues with None Identified      Family Dynamics   Good Support System? Yes   wife, grandchildren,     Barriers   Psychosocial barriers to participate in program There are no identifiable barriers or psychosocial needs.      Screening Interventions   Interventions Encouraged to exercise    Expected Outcomes Short Term goal: Utilizing psychosocial counselor, staff and physician to assist with identification of specific Stressors or current issues interfering with healing process. Setting desired goal for each stressor or current issue identified.;Long Term Goal: Stressors or current issues are controlled or eliminated.;Short Term goal: Identification and review with participant of any Quality of Life or Depression concerns found by scoring the questionnaire.;Long Term goal: The participant improves quality of Life and PHQ9 Scores as seen by post scores and/or verbalization of changes             Quality of Life Scores:   Quality of Life - 08/15/21 1327       Quality of Life   Select Quality of Life      Quality of Life Scores   Health/Function Pre 29.57 %    Socioeconomic Pre 29.17 %    Psych/Spiritual Pre 29.29 %    Family Pre 28.75 %    GLOBAL Pre 29.32 %            Scores of 19 and below usually indicate a poorer quality of life in these areas.  A difference of  2-3 points is a clinically meaningful difference.  A difference of 2-3 points in the total score of the Quality of Life Index has  been associated with significant improvement in overall quality of life, self-image, physical symptoms, and general health in studies assessing change in quality of life.  PHQ-9: Recent Review Flowsheet Data     Depression screen [facility] 2/9 08/15/2021   Decreased Interest 0   Down, Depressed, Hopeless 0  PHQ - 2 Score 0   Altered sleeping 0   Tired, decreased energy 1   Change in appetite 0   Feeling bad or failure about yourself  0   Trouble concentrating 0   Moving slowly or fidgety/restless 0   Suicidal thoughts 0   PHQ-9 Score 1      Interpretation of Total Score  Total Score Depression Severity:  1-4 = Minimal depression, 5-9 = Mild depression, 10-14 = Moderate depression, 15-19 = Moderately severe depression, 20-27 = Severe depression   Psychosocial Evaluation and Intervention:  Psychosocial Evaluation - 08/02/21 1048       Psychosocial Evaluation & Interventions   Interventions Encouraged to exercise with the program and follow exercise prescription    Comments Maynard has no barriers to attending the program. He is retired and continues to [employer] with his company. He travels frequesntly  and stays active. daily walks, to hunting and other activities. His wife and family and the employees of his company are his support team.  He expresses no stress and he is thankful for every day he sees the sun.  He will work on the program as a hybrid participnat,and will review the session schedule with the EP during his Exercise evaluation and gym orientation.    Expected Outcomes STG: Tylar will continue to grow with his knowledge of cardiovascular disease and how to manage his risk factors.  LTG: Desiree will continue to progress his exercise and work on keeping his risk factors under control.    Continue Psychosocial Services  Follow up required by staff             Psychosocial Re-Evaluation:   Psychosocial Discharge (Final Psychosocial Re-Evaluation):   Vocational  Rehabilitation: Provide vocational rehab assistance to qualifying candidates.   Vocational Rehab Evaluation & Intervention:   Education: Education Goals: Education classes will be provided on a variety of topics geared toward better understanding of heart health and risk factor modification. Participant will state understanding/return demonstration of topics presented as noted by education test scores.  Learning Barriers/Preferences:   General Cardiac Education Topics:  AED/CPR: - Group verbal and written instruction with the use of models to demonstrate the basic use of the AED with the basic ABC's of resuscitation.   Anatomy and Cardiac Procedures: - Group verbal and visual presentation and models provide information about basic cardiac anatomy and function. Reviews the testing methods done to diagnose heart disease and the outcomes of the test results. Describes the treatment choices: Medical Management, Angioplasty, or Coronary Bypass Surgery for treating various heart conditions including Myocardial Infarction, Angina, Valve Disease, and Cardiac Arrhythmias.  Written material given at graduation.   Medication Safety: - Group verbal and visual instruction to review commonly prescribed medications for heart and lung disease. Reviews the medication, class of the drug, and side effects. Includes the steps to properly store meds and maintain the prescription regimen.  Written material given at graduation.   Intimacy: - Group verbal instruction through game format to discuss how heart and lung disease can affect sexual intimacy. Written material given at graduation..   Know Your Numbers and Heart Failure: - Group verbal and visual instruction to discuss disease risk factors for cardiac and pulmonary disease and treatment options.  Reviews associated critical values for Overweight/Obesity, Hypertension, Cholesterol, and Diabetes.  Discusses basics of heart failure: signs/symptoms and  treatments.  Introduces Heart Failure Zone chart for action plan for heart failure.  Written material given at graduation.   Infection Prevention: -  Provides verbal and written material to individual with discussion of infection control including proper hand washing and proper equipment cleaning during exercise session. Flowsheet Row Cardiac Rehab from 08/15/2021 in [facility] Cardiac and Pulmonary Rehab  Education need identified 08/15/21  Date 08/15/21  Educator [place]  Instruction Review Code 1- Verbalizes Understanding       Falls Prevention: - Provides verbal and written material to individual with discussion of falls prevention and safety. Flowsheet Row Cardiac Rehab from 08/15/2021 in [facility] Cardiac and Pulmonary Rehab  Education need identified 08/15/21  Date 08/15/21  Educator [place]  Instruction Review Code 1- Verbalizes Understanding       Other: -Provides group and verbal instruction on various topics (see comments)   Knowledge Questionnaire Score:  Knowledge Questionnaire Score - 08/15/21 1328       Knowledge Questionnaire Score   Pre Score 24/26: PAD, Nutrition             Core Components/Risk Factors/Patient Goals at Admission:  Personal Goals and Risk Factors at Admission - 08/15/21 1418       Core Components/Risk Factors/Patient Goals on Admission    Weight Management Yes;Weight Loss    Intervention Weight Management: Develop a combined nutrition and exercise program designed to reach desired caloric intake, while maintaining appropriate intake of nutrient and fiber, sodium and fats, and appropriate energy expenditure required for the weight goal.;Weight Management: Provide education and appropriate resources to help participant work on and attain dietary goals.;Weight Management/Obesity: Establish reasonable short term and long term weight goals.    Admit Weight 243 lb (110.2 kg)    Goal Weight: Short Term 239 lb (108.4 kg)    Goal Weight: Long Term 233 lb (105.7  kg)    Expected Outcomes Short Term: Continue to assess and modify interventions until short term weight is achieved;Weight Maintenance: Understanding of the daily nutrition guidelines, which includes 25-35% calories from fat, 7% or less cal from saturated fats, less than 200mg  cholesterol, less than 1.5gm of sodium, & 5 or more servings of fruits and vegetables daily;Weight Loss: Understanding of general recommendations for a balanced deficit meal plan, which promotes 1-2 lb weight loss per week and includes a negative energy balance of [phone] kcal/d;Understanding recommendations for meals to include 15-35% energy as protein, 25-35% energy from fat, 35-60% energy from carbohydrates, less than 200mg  of dietary cholesterol, 20-35 gm of total fiber daily;Understanding of distribution of calorie intake throughout the day with the consumption of 4-5 meals/snacks    Hypertension Yes    Intervention Provide education on lifestyle modifcations including regular physical activity/exercise, weight management, moderate sodium restriction and increased consumption of fresh fruit, vegetables, and low fat dairy, alcohol moderation, and smoking cessation.;Monitor prescription use compliance.    Expected Outcomes Short Term: Continued assessment and intervention until BP is < 140/17mm HG in hypertensive participants. < 130/53mm HG in hypertensive participants with diabetes, heart failure or chronic kidney disease.;Long Term: Maintenance of blood pressure at goal levels.    Lipids Yes    Intervention Provide education and support for participant on nutrition & aerobic/resistive exercise along with prescribed medications to achieve LDL 70mg , HDL >40mg .    Expected Outcomes Short Term: Participant states understanding of desired cholesterol values and is compliant with medications prescribed. Participant is following exercise prescription and nutrition guidelines.;Long Term: Cholesterol controlled with medications as  prescribed, with individualized exercise RX and with personalized nutrition plan. Value goals: LDL < 70mg , HDL > 40 mg.  Education:Diabetes - Individual verbal and written instruction to review signs/symptoms of diabetes, desired ranges of glucose level fasting, after meals and with exercise. Acknowledge that pre and post exercise glucose checks will be done for 3 sessions at entry of program.   Core Components/Risk Factors/Patient Goals Review:    Core Components/Risk Factors/Patient Goals at Discharge (Final Review):    ITP Comments:  ITP Comments     Row Name 08/02/21 1056 08/15/21 1210 08/21/21 1528 09/06/21 0850     ITP Comments Virtual orientation call completed today. he has an appointment on Date: 08/15/2021  for EP eval and gym Orientation.  Documentation of diagnosis can be found in [facility]  Date: 06/12/2021 . Completed 6MWT and gym orientation. Initial ITP created and sent for review toDr. Emily Filbert, Medical Director. Patient will be participating in the hybrid program. Will attend to rehab in person 1x/month. First follow up email for patient completed and sent today for patient's cardiac rehab hybrid program. EP sent patient home exercise plan instructions including THRZ, exercise restrictions, symptoms to monitor, and how to send an exercise log going forward. EP sent [facility] Youtube account as well as RPE & Dyspnea scales. Encouraged patient to ask questions. Will follow up weekly with home exercise. 30 day review completed. ITP sent to Dr. Emily Filbert, Medical Director of Cardiac Rehab. Continue with ITP unless changes are made by physician.  Pt is doing our hybrid program and supposed to come for monthly check in in person tomorrow.             Comments: 30 day review

## 2021-09-07 ENCOUNTER — Encounter: Payer: Medicare Other | Attending: Internal Medicine

## 2021-09-07 ENCOUNTER — Other Ambulatory Visit: Payer: Self-pay

## 2021-09-07 DIAGNOSIS — Z951 Presence of aortocoronary bypass graft: Secondary | ICD-10-CM | POA: Diagnosis not present

## 2021-09-07 NOTE — Progress Notes (Signed)
Daily Session Note  Patient Details  Name: Adam E Chauca Jr. MRN: [id number] Date of Birth: 04/22/1946 Referring Provider:   Flowsheet Row Cardiac Rehab from 08/15/2021 in [facility]  Referring Provider Kowalski, Bruce MD       Encounter Date: 09/07/2021  Check In:  Session Check In - 09/07/21 0723       Check-In   Supervising physician immediately available to respond to emergencies See telemetry face sheet for immediately available ER MD    Location [facility]    Staff Present Kelly Bollinger, MPA, RN;Jessica Hawkins, MA, RCEP, CCRP, CCET;Melissa Caiola, RDN, LDN    Virtual Visit No    Medication changes reported     Yes    Comments lisinopril 10mg; amlodipine 5mg    Fall or balance concerns reported    No    Warm-up and Cool-down Performed on first and last piece of equipment    Resistance Training Performed Yes    VAD Patient? No    PAD/SET Patient? No      Pain Assessment   Currently in Pain? No/denies                Social History   Tobacco Use  Smoking Status Former   Types: Cigars  Smokeless Tobacco Never    Goals Met:  Independence with exercise equipment Exercise tolerated well No report of concerns or symptoms today Strength training completed today  Goals Unmet:  Not Applicable  Comments: First full day of exercise!  Patient was oriented to gym and equipment including functions, settings, policies, and procedures.  Patient's individual exercise prescription and treatment plan were reviewed.  All starting workloads were established based on the results of the 6 minute walk test done at initial orientation visit.  The plan for exercise progression was also introduced and progression will be customized based on patient's performance and goals.    Dr. Mark Miller is Medical Director for [facility].  Dr. Fuad Aleskerov is Medical Director for [facility]. 

## 2021-09-27 NOTE — Progress Notes (Signed)
Dolph has been keeping a log of his walking at home.  Staff have reviewed and will check in next time he has in person visit.

## 2021-10-04 ENCOUNTER — Encounter: Payer: Self-pay | Admitting: *Deleted

## 2021-10-04 DIAGNOSIS — Z951 Presence of aortocoronary bypass graft: Secondary | ICD-10-CM

## 2021-10-04 NOTE — Progress Notes (Signed)
Cardiac Individual Treatment Plan  Patient Details  Name: Angelus Hoopes. MRN: [id number] Date of Birth: Sep 03, 1946 Referring Provider:   Flowsheet Row Cardiac Rehab from 08/15/2021 in [facility] Cardiac and Pulmonary Rehab  Referring Provider Serafina Royals MD       Initial Encounter Date:  Flowsheet Row Cardiac Rehab from 08/15/2021 in [facility] Cardiac and Pulmonary Rehab  Date 08/15/21       Visit Diagnosis: S/P CABG x 3 06/13/2021  Patient's Home Medications on Admission:  Current Outpatient Medications:    apixaban (ELIQUIS) 5 MG TABS tablet, Take 1 tablet (5 mg total) by mouth 2 (two) times daily., Disp: 60 tablet, Rfl: 0   apixaban (ELIQUIS) 5 MG TABS tablet, Take 1 tablet by mouth 2 (two) times daily., Disp: , Rfl:    aspirin 81 MG tablet, Take 81 mg by mouth daily., Disp: , Rfl:    atorvastatin (LIPITOR) 80 MG tablet, Take 80 mg by mouth every evening. , Disp: , Rfl:    diltiazem (CARDIZEM CD) 180 MG 24 hr capsule, Take 1 capsule (180 mg total) by mouth daily., Disp: 30 capsule, Rfl: 0   esomeprazole (NEXIUM) 40 MG capsule, Take 40 mg by mouth daily before breakfast., Disp: , Rfl:    metoprolol tartrate (LOPRESSOR) 25 MG tablet, Take 1 tablet (25 mg total) by mouth 2 (two) times daily., Disp: 60 tablet, Rfl: 0   nitroGLYCERIN (NITROSTAT) 0.4 MG SL tablet, Place 0.5 mg under the tongue as directed. (Patient not taking: Reported on 06/24/2021), Disp: , Rfl:    PRALUENT 75 MG/ML SOAJ, SMARTSIG:75 Milligram(s) SUB-Q Every 2 Weeks, Disp: , Rfl:    Tamsulosin HCl (FLOMAX) 0.4 MG CAPS, Take 0.4 mg by mouth daily., Disp: , Rfl:   Past Medical History: Past Medical History:  Diagnosis Date   A-fib ([place])    now in normal sinus rhytm   Aortic stenosis    Aortic valve replaced 03/2013   Coronary artery disease    one vessel coronary artery disease, status post DES stent placement at time of aortic valve replacement April 2014. Followed by Dr. Serafina Royals   Dysrhythmia    Elevated PSA     GERD (gastroesophageal reflux disease)    with Barretts esophagus followed by Dr. [name]   Heart attack [facility])    Heart murmur    Hyperlipidemia    Hypertension    Pneumonia    Sleep apnea     Tobacco Use: Social History   Tobacco Use  Smoking Status Former   Types: Cigars  Smokeless Tobacco Never    Labs: Recent Review Flowsheet Data   There is no flowsheet data to display.      Exercise Target Goals: Exercise Program Goal: Individual exercise prescription set using results from initial 6 min walk test and THRR while considering  patient's activity barriers and safety.   Exercise Prescription Goal: Initial exercise prescription builds to 30-45 minutes a day of aerobic activity, 2-3 days per week.  Home exercise guidelines will be given to patient during program as part of exercise prescription that the participant will acknowledge.   Education: Aerobic Exercise: - Group verbal and visual presentation on the components of exercise prescription. Introduces F.I.T.T principle from ACSM for exercise prescriptions.  Reviews F.I.T.T. principles of aerobic exercise including progression. Written material given at graduation. Flowsheet Row Cardiac Rehab from 08/15/2021 in [facility] Cardiac and Pulmonary Rehab  Education need identified 08/15/21       Education: Resistance Exercise: - Group verbal  and visual presentation on the components of exercise prescription. Introduces F.I.T.T principle from ACSM for exercise prescriptions  Reviews F.I.T.T. principles of resistance exercise including progression. Written material given at graduation.    Education: Exercise & Equipment Safety: - Individual verbal instruction and demonstration of equipment use and safety with use of the equipment. Flowsheet Row Cardiac Rehab from 08/15/2021 in [facility] Cardiac and Pulmonary Rehab  Education need identified 08/15/21  Date 08/15/21  Educator [place]  Instruction Review Code 1- Verbalizes Understanding        Education: Exercise Physiology & General Exercise Guidelines: - Group verbal and written instruction with models to review the exercise physiology of the cardiovascular system and associated critical values. Provides general exercise guidelines with specific guidelines to those with heart or lung disease.    Education: Flexibility, Balance, Mind/Body Relaxation: - Group verbal and visual presentation with interactive activity on the components of exercise prescription. Introduces F.I.T.T principle from ACSM for exercise prescriptions. Reviews F.I.T.T. principles of flexibility and balance exercise training including progression. Also discusses the mind body connection.  Reviews various relaxation techniques to help reduce and manage stress (i.e. Deep breathing, progressive muscle relaxation, and visualization). Balance handout provided to take home. Written material given at graduation.   Activity Barriers & Risk Stratification:  Activity Barriers & Cardiac Risk Stratification - 08/15/21 1356       Activity Barriers & Cardiac Risk Stratification   Activity Barriers None    Cardiac Risk Stratification High             6 Minute Walk:  6 Minute Walk     Row Name 08/15/21 1329         6 Minute Walk   Phase Initial     Distance 1460 feet     Walk Time 6 minutes     # of Rest Breaks 0     MPH 2.76     METS 2.67     RPE 9     Perceived Dyspnea  0     VO2 Peak 9.35     Symptoms No     Resting HR 58 bpm     Resting BP 128/72     Resting Oxygen Saturation  99 %     Exercise Oxygen Saturation  during 6 min walk 97 %     Max Ex. HR 92 bpm     Max Ex. BP 140/72     2 Minute Post BP 130/72              Oxygen Initial Assessment:   Oxygen Re-Evaluation:   Oxygen Discharge (Final Oxygen Re-Evaluation):   Initial Exercise Prescription:  Initial Exercise Prescription - 08/15/21 1400       Date of Initial Exercise RX and Referring Provider   Date 08/15/21     Referring Provider Serafina Royals MD      Treadmill   MPH 2.9    Grade 1    Minutes 15    METs 3.62      NuStep   Level 3    SPM 80    Minutes 15    METs 2.6      REL-XR   Level 2    Speed 50    Minutes 15    METs 2.6      Prescription Details   Frequency (times per week) Hybrid   1x/month in rehab; Home exercise provided   Duration Progress to 30 minutes of continuous aerobic without signs/symptoms of physical  distress      Intensity   THRR 40-80% of Max Heartrate 92-127    Ratings of Perceived Exertion 11-13    Perceived Dyspnea 0-4      Progression   Progression Continue to progress workloads to maintain intensity without signs/symptoms of physical distress.      Resistance Training   Training Prescription Yes    Weight 5 lb    Reps 10-15             Perform Capillary Blood Glucose checks as needed.  Exercise Prescription Changes:   Exercise Prescription Changes     Row Name 08/15/21 1400 09/11/21 0900 09/27/21 1200         Response to Exercise   Blood Pressure (Admit) 128/72 128/64 136/92     Blood Pressure (Exercise) 140/72 128/70 --     Blood Pressure (Exit) 130/72 124/68 125/73     Heart Rate (Admit) 58 bpm 90 bpm 50 bpm     Heart Rate (Exercise) 92 bpm 100 bpm --     Heart Rate (Exit) 63 bpm 75 bpm 105 bpm     Oxygen Saturation (Admit) 99 % -- 98 %     Oxygen Saturation (Exercise) 97 % -- 95 %     Oxygen Saturation (Exit) 99 % -- --     Rating of Perceived Exertion (Exercise) 9 13 --     Perceived Dyspnea (Exercise) 0 0 --     Symptoms none none --     Comments walk test results 1st day session in rehab self reported walking     Duration -- Continue with 30 min of aerobic exercise without signs/symptoms of physical distress. Continue with 30 min of aerobic exercise without signs/symptoms of physical distress.     Intensity -- THRR unchanged THRR unchanged       Progression   Progression -- Continue to progress workloads to maintain  intensity without signs/symptoms of physical distress. Continue to progress workloads to maintain intensity without signs/symptoms of physical distress.     Average METs -- 3.48 3.4       Resistance Training   Training Prescription -- Yes --     Weight -- 5 lb --     Reps -- 10-15 --       Interval Training   Interval Training -- No --       Treadmill   MPH -- 3.2 --     Grade -- 1 --     Minutes -- 15 --     METs -- 3.89 --       REL-XR   Level -- 5 --     Minutes -- 15 --     METs -- 3.6 --       Home Exercise Plan   Plans to continue exercise at -- Home (comment)  Hybrid: walking Home (comment)  Hybrid: walking     Frequency -- Add 3 additional days to program exercise sessions.  Hybrid; 1 session in person/ month Add 3 additional days to program exercise sessions.  Hybrid; 1 session in person/ month     Initial Home Exercises Provided -- 08/21/21 08/21/21              Exercise Comments:   Exercise Comments     Row Name 09/07/21 0724           Exercise Comments First full day of exercise!  Patient was oriented to gym and equipment including functions, settings, policies, and  procedures.  Patient's individual exercise prescription and treatment plan were reviewed.  All starting workloads were established based on the results of the 6 minute walk test done at initial orientation visit.  The plan for exercise progression was also introduced and progression will be customized based on patient's performance and goals.                Exercise Goals and Review:   Exercise Goals     Row Name 08/15/21 1405             Exercise Goals   Increase Physical Activity Yes       Intervention Provide advice, education, support and counseling about physical activity/exercise needs.;Develop an individualized exercise prescription for aerobic and resistive training based on initial evaluation findings, risk stratification, comorbidities and participant's personal goals.        Expected Outcomes Short Term: Attend rehab on a regular basis to increase amount of physical activity.;Long Term: Add in home exercise to make exercise part of routine and to increase amount of physical activity.;Long Term: Exercising regularly at least 3-5 days a week.       Increase Strength and Stamina Yes       Intervention Develop an individualized exercise prescription for aerobic and resistive training based on initial evaluation findings, risk stratification, comorbidities and participant's personal goals.;Provide advice, education, support and counseling about physical activity/exercise needs.       Expected Outcomes Short Term: Increase workloads from initial exercise prescription for resistance, speed, and METs.;Short Term: Perform resistance training exercises routinely during rehab and add in resistance training at home;Long Term: Improve cardiorespiratory fitness, muscular endurance and strength as measured by increased METs and functional capacity (6MWT)       Able to understand and use rate of perceived exertion (RPE) scale Yes       Intervention Provide education and explanation on how to use RPE scale       Expected Outcomes Short Term: Able to use RPE daily in rehab to express subjective intensity level;Long Term:  Able to use RPE to guide intensity level when exercising independently       Able to understand and use Dyspnea scale Yes       Intervention Provide education and explanation on how to use Dyspnea scale       Expected Outcomes Short Term: Able to use Dyspnea scale daily in rehab to express subjective sense of shortness of breath during exertion;Long Term: Able to use Dyspnea scale to guide intensity level when exercising independently       Knowledge and understanding of Target Heart Rate Range (THRR) Yes       Intervention Provide education and explanation of THRR including how the numbers were predicted and where they are located for reference       Expected Outcomes  Long Term: Able to use THRR to govern intensity when exercising independently;Short Term: Able to state/look up THRR;Short Term: Able to use daily as guideline for intensity in rehab       Able to check pulse independently Yes       Intervention Review the importance of being able to check your own pulse for safety during independent exercise;Provide education and demonstration on how to check pulse in carotid and radial arteries.       Expected Outcomes Short Term: Able to explain why pulse checking is important during independent exercise;Long Term: Able to check pulse independently and accurately       Understanding of  Exercise Prescription Yes       Intervention Provide education, explanation, and written materials on patient's individual exercise prescription       Expected Outcomes Short Term: Able to explain program exercise prescription;Long Term: Able to explain home exercise prescription to exercise independently                Exercise Goals Re-Evaluation :  Exercise Goals Re-Evaluation     Row Name 09/07/21 0724 09/11/21 1001 09/27/21 1225         Exercise Goal Re-Evaluation   Exercise Goals Review Increase Physical Activity;Able to understand and use rate of perceived exertion (RPE) scale;Knowledge and understanding of Target Heart Rate Range (THRR);Understanding of Exercise Prescription;Increase Strength and Stamina;Able to understand and use Dyspnea scale;Able to check pulse independently Increase Physical Activity;Increase Strength and Stamina;Understanding of Exercise Prescription Increase Physical Activity;Increase Strength and Stamina     Comments Reviewed RPE and dyspnea scales, THR and program prescription with pt today.  Pt voiced understanding and was given a copy of goals to take home. Zayne is doing well. He is participating in the hybrid [facility] program and is continuing to send in home exercise weekly. He participated in an in person cardaic rehab session on 10/6  and tolerated exercise well. His treadmill exercise prescription from the beginning increased to 3.2 speed and 1% incline. His home exercise shows walking exercise sessions almost daily, ranging from 22-34 minutes at a time. He walks for about 1.3 miles on average at an average of 3.2 mph. Patient is recording HR before & after but is not obtaining his max during exercise, which I encouraged him to write down. Will continue to follow up with patient until his next in-person visit on 11/23. Also reviewed home exercise with pt today.  Patient will be walking at home. Reviewed THR, pulse, RPE, sign and symptoms, pulse oximetery and when to call 911 or MD.  Also discussed weather considerations and indoor options.  Pt voiced understanding. Graison is exercising consistently according to records he sends in to staff.  He walks outside.  From his records of post HR,he is working in correct THR range.  He would benefit from adding some strength work and stretching to his routine.  Staff will follow up with him about strength and stretching next session.     Expected Outcomes Short: Use RPE daily to regulate intensity. Long: Follow program prescription in THR. Short: Continue home exercise and continue to hit THR Long: Continue to exercise independently at home at appropriate prescription Short: continue consistent cardio and add stretching Long:  build overall stamina              Discharge Exercise Prescription (Final Exercise Prescription Changes):  Exercise Prescription Changes - 09/27/21 1200       Response to Exercise   Blood Pressure (Admit) 136/92    Blood Pressure (Exit) 125/73    Heart Rate (Admit) 50 bpm    Heart Rate (Exit) 105 bpm    Oxygen Saturation (Admit) 98 %    Oxygen Saturation (Exercise) 95 %    Comments self reported walking    Duration Continue with 30 min of aerobic exercise without signs/symptoms of physical distress.    Intensity THRR unchanged      Progression   Progression  Continue to progress workloads to maintain intensity without signs/symptoms of physical distress.    Average METs 3.4      Home Exercise Plan   Plans to continue exercise at Home (  comment)   Hybrid: walking   Frequency Add 3 additional days to program exercise sessions.   Hybrid; 1 session in person/ month   Initial Home Exercises Provided 08/21/21             Nutrition:  Target Goals: Understanding of nutrition guidelines, daily intake of sodium 1500mg , cholesterol 200mg , calories 30% from fat and 7% or less from saturated fats, daily to have 5 or more servings of fruits and vegetables.  Education: All About Nutrition: -Group instruction provided by verbal, written material, interactive activities, discussions, models, and posters to present general guidelines for heart healthy nutrition including fat, fiber, MyPlate, the role of sodium in heart healthy nutrition, utilization of the nutrition label, and utilization of this knowledge for meal planning. Follow up email sent as well. Written material given at graduation. Flowsheet Row Cardiac Rehab from 08/15/2021 in [facility] Cardiac and Pulmonary Rehab  Education need identified 08/15/21       Biometrics:  Pre Biometrics - 08/15/21 1356       Pre Biometrics   Height 6' 1.2" (1.859 m)    Weight 243 lb 12.8 oz (110.6 kg)    BMI (Calculated) 32    Single Leg Stand 30 seconds              Nutrition Therapy Plan and Nutrition Goals:  Nutrition Therapy & Goals - 09/07/21 0736       Nutrition Therapy   Diet Heart healthy, low Na    Drug/Food Interactions Statins/Certain Fruits    Protein (specify units) 85g    Fiber 30 grams    Whole Grain Foods 3 servings    Saturated Fats 12 max. grams    Fruits and Vegetables 8 servings/day    Sodium 1.5 grams      Personal Nutrition Goals   Nutrition Goal ST:LT: continue with current diet, no goals at this time (goal making sheet provided)    Comments Jace reports doing well with  his diet. His PYP was 83, a good score is >60. Unable to get a full food recall; he limits processed foods, only uses salt on tomato sandwiches, does not eat any high fat dairy products, eats mostly chicken and fish for his protein sources. He reports enjoying his food. Rahsaan reports having a good understanding of heart healthy eating, reviewed specifics and numbers. Provided handouts.      Intervention Plan   Intervention Prescribe, educate and counsel regarding individualized specific dietary modifications aiming towards targeted core components such as weight, hypertension, lipid management, diabetes, heart failure and other comorbidities.;Nutrition handout(s) given to patient.    Expected Outcomes Short Term Goal: Understand basic principles of dietary content, such as calories, fat, sodium, cholesterol and nutrients.;Short Term Goal: A plan has been developed with personal nutrition goals set during dietitian appointment.;Long Term Goal: Adherence to prescribed nutrition plan.             Nutrition Assessments:  MEDIFICTS Score Key: ?70 Need to make dietary changes  40-70 Heart Healthy Diet ? 40 Therapeutic Level Cholesterol Diet  Flowsheet Row Cardiac Rehab from 08/15/2021 in [facility] Cardiac and Pulmonary Rehab  Picture Your Plate Total Score on Admission 83      Picture Your Plate Scores: <56 Unhealthy dietary pattern with much room for improvement. 41-50 Dietary pattern unlikely to meet recommendations for good health and room for improvement. 51-60 More healthful dietary pattern, with some room for improvement.  >60 Healthy dietary pattern, although there may be some specific behaviors  that could be improved.    Nutrition Goals Re-Evaluation:   Nutrition Goals Discharge (Final Nutrition Goals Re-Evaluation):   Psychosocial: Target Goals: Acknowledge presence or absence of significant depression and/or stress, maximize coping skills, provide positive support system.  Participant is able to verbalize types and ability to use techniques and skills needed for reducing stress and depression.   Education: Stress, Anxiety, and Depression - Group verbal and visual presentation to define topics covered.  Reviews how body is impacted by stress, anxiety, and depression.  Also discusses healthy ways to reduce stress and to treat/manage anxiety and depression.  Written material given at graduation.   Education: Sleep Hygiene -Provides group verbal and written instruction about how sleep can affect your health.  Define sleep hygiene, discuss sleep cycles and impact of sleep habits. Review good sleep hygiene tips.    Initial Review & Psychosocial Screening:  Initial Psych Review & Screening - 08/02/21 1039       Initial Review   Current issues with None Identified      Family Dynamics   Good Support System? Yes   wife, grandchildren,     Barriers   Psychosocial barriers to participate in program There are no identifiable barriers or psychosocial needs.      Screening Interventions   Interventions Encouraged to exercise    Expected Outcomes Short Term goal: Utilizing psychosocial counselor, staff and physician to assist with identification of specific Stressors or current issues interfering with healing process. Setting desired goal for each stressor or current issue identified.;Long Term Goal: Stressors or current issues are controlled or eliminated.;Short Term goal: Identification and review with participant of any Quality of Life or Depression concerns found by scoring the questionnaire.;Long Term goal: The participant improves quality of Life and PHQ9 Scores as seen by post scores and/or verbalization of changes             Quality of Life Scores:   Quality of Life - 08/15/21 1327       Quality of Life   Select Quality of Life      Quality of Life Scores   Health/Function Pre 29.57 %    Socioeconomic Pre 29.17 %    Psych/Spiritual Pre 29.29 %     Family Pre 28.75 %    GLOBAL Pre 29.32 %            Scores of 19 and below usually indicate a poorer quality of life in these areas.  A difference of  2-3 points is a clinically meaningful difference.  A difference of 2-3 points in the total score of the Quality of Life Index has been associated with significant improvement in overall quality of life, self-image, physical symptoms, and general health in studies assessing change in quality of life.  PHQ-9: Recent Review Flowsheet Data     Depression screen [facility] 2/9 08/15/2021   Decreased Interest 0   Down, Depressed, Hopeless 0   PHQ - 2 Score 0   Altered sleeping 0   Tired, decreased energy 1   Change in appetite 0   Feeling bad or failure about yourself  0   Trouble concentrating 0   Moving slowly or fidgety/restless 0   Suicidal thoughts 0   PHQ-9 Score 1      Interpretation of Total Score  Total Score Depression Severity:  1-4 = Minimal depression, 5-9 = Mild depression, 10-14 = Moderate depression, 15-19 = Moderately severe depression, 20-27 = Severe depression   Psychosocial Evaluation and Intervention:  Psychosocial Evaluation - 08/02/21 1048       Psychosocial Evaluation & Interventions   Interventions Encouraged to exercise with the program and follow exercise prescription    Comments Layman has no barriers to attending the program. He is retired and continues to [employer] with his company. He travels frequesntly  and stays active. daily walks, to hunting and other activities. His wife and family and the employees of his company are his support team.  He expresses no stress and he is thankful for every day he sees the sun.  He will work on the program as a hybrid participnat,and will review the session schedule with the EP during his Exercise evaluation and gym orientation.    Expected Outcomes STG: Shyquan will continue to grow with his knowledge of cardiovascular disease and how to manage his risk factors.  LTG: Glennie will  continue to progress his exercise and work on keeping his risk factors under control.    Continue Psychosocial Services  Follow up required by staff             Psychosocial Re-Evaluation:  Psychosocial Re-Evaluation     [place] Name 09/07/21 0734             Psychosocial Re-Evaluation   Current issues with None Identified       Comments Elby reports no stress at this time. is grateful to be alive and stays positive. He has a great support system in his family. He reports sleeping "like a log" and he goes to be at 9 and wakes up at 5.       Expected Outcomes ST: continue to use family for support LT: maintain positive attitude       Interventions Encouraged to attend Cardiac Rehabilitation for the exercise       Continue Psychosocial Services  Follow up required by staff                Psychosocial Discharge (Final Psychosocial Re-Evaluation):  Psychosocial Re-Evaluation - 09/07/21 0734       Psychosocial Re-Evaluation   Current issues with None Identified    Comments Trejan reports no stress at this time. is grateful to be alive and stays positive. He has a great support system in his family. He reports sleeping "like a log" and he goes to be at 9 and wakes up at 5.    Expected Outcomes ST: continue to use family for support LT: maintain positive attitude    Interventions Encouraged to attend Cardiac Rehabilitation for the exercise    Continue Psychosocial Services  Follow up required by staff             Vocational Rehabilitation: Provide vocational rehab assistance to qualifying candidates.   Vocational Rehab Evaluation & Intervention:   Education: Education Goals: Education classes will be provided on a variety of topics geared toward better understanding of heart health and risk factor modification. Participant will state understanding/return demonstration of topics presented as noted by education test scores.  Learning Barriers/Preferences:   General  Cardiac Education Topics:  AED/CPR: - Group verbal and written instruction with the use of models to demonstrate the basic use of the AED with the basic ABC's of resuscitation.   Anatomy and Cardiac Procedures: - Group verbal and visual presentation and models provide information about basic cardiac anatomy and function. Reviews the testing methods done to diagnose heart disease and the outcomes of the test results. Describes the treatment choices: Medical Management, Angioplasty, or Coronary Bypass Surgery  for treating various heart conditions including Myocardial Infarction, Angina, Valve Disease, and Cardiac Arrhythmias.  Written material given at graduation.   Medication Safety: - Group verbal and visual instruction to review commonly prescribed medications for heart and lung disease. Reviews the medication, class of the drug, and side effects. Includes the steps to properly store meds and maintain the prescription regimen.  Written material given at graduation.   Intimacy: - Group verbal instruction through game format to discuss how heart and lung disease can affect sexual intimacy. Written material given at graduation..   Know Your Numbers and Heart Failure: - Group verbal and visual instruction to discuss disease risk factors for cardiac and pulmonary disease and treatment options.  Reviews associated critical values for Overweight/Obesity, Hypertension, Cholesterol, and Diabetes.  Discusses basics of heart failure: signs/symptoms and treatments.  Introduces Heart Failure Zone chart for action plan for heart failure.  Written material given at graduation.   Infection Prevention: - Provides verbal and written material to individual with discussion of infection control including proper hand washing and proper equipment cleaning during exercise session. Flowsheet Row Cardiac Rehab from 08/15/2021 in [facility]  Education need identified 08/15/21  Date 08/15/21   Educator [place]  Instruction Review Code 1- Verbalizes Understanding       Falls Prevention: - Provides verbal and written material to individual with discussion of falls prevention and safety. Flowsheet Row Cardiac Rehab from 08/15/2021 in [facility] Re[facility]  Education need identified 08/15/21  Date 08/15/21  Educator [place]  Instruction Review Code 1- Verbalizes Understanding       Other: -Provides group and verbal instruction on various topics (see comments)   Knowledge Questionnaire Score:  Knowledge Questionnaire Score - 08/15/21 1328       Knowledge Questionnaire Score   Pre Score 24/26: PAD, Nutrition             Core Components/Risk Factors/Patient Goals at Admission:  Personal Goals and Risk Factors at Admission - 08/15/21 1418       Core Components/Risk Factors/Patient Goals on Admission    Weight Management Yes;Weight Loss    Intervention Weight Management: Develop a combined nutrition and exercise program designed to reach desired caloric intake, while maintaining appropriate intake of nutrient and fiber, sodium and fats, and appropriate energy expenditure required for the weight goal.;Weight Management: Provide education and appropriate resources to help participant work on and attain dietary goals.;Weight Management/Obesity: Establish reasonable short term and long term weight goals.    Admit Weight 243 lb (110.2 kg)    Goal Weight: Short Term 239 lb (108.4 kg)    Goal Weight: Long Term 233 lb (105.7 kg)    Expected Outcomes Short Term: Continue to assess and modify interventions until short term weight is achieved;Weight Maintenance: Understanding of the daily nutrition guidelines, which includes 25-35% calories from fat, 7% or less cal from saturated fats, less than 200mg  cholesterol, less than 1.5gm of sodium, & 5 or more servings of fruits and vegetables daily;Weight Loss: Understanding of general recommendations for a balanced deficit meal plan,  which promotes 1-2 lb weight loss per week and includes a negative energy balance of [phone] kcal/d;Understanding recommendations for meals to include 15-35% energy as protein, 25-35% energy from fat, 35-60% energy from carbohydrates, less than 200mg  of dietary cholesterol, 20-35 gm of total fiber daily;Understanding of distribution of calorie intake throughout the day with the consumption of 4-5 meals/snacks    Hypertension Yes    Intervention Provide education  on lifestyle modifcations including regular physical activity/exercise, weight management, moderate sodium restriction and increased consumption of fresh fruit, vegetables, and low fat dairy, alcohol moderation, and smoking cessation.;Monitor prescription use compliance.    Expected Outcomes Short Term: Continued assessment and intervention until BP is < 140/24mm HG in hypertensive participants. < 130/55mm HG in hypertensive participants with diabetes, heart failure or chronic kidney disease.;Long Term: Maintenance of blood pressure at goal levels.    Lipids Yes    Intervention Provide education and support for participant on nutrition & aerobic/resistive exercise along with prescribed medications to achieve LDL 70mg , HDL >40mg .    Expected Outcomes Short Term: Participant states understanding of desired cholesterol values and is compliant with medications prescribed. Participant is following exercise prescription and nutrition guidelines.;Long Term: Cholesterol controlled with medications as prescribed, with individualized exercise RX and with personalized nutrition plan. Value goals: LDL < 70mg , HDL > 40 mg.             Education:Diabetes - Individual verbal and written instruction to review signs/symptoms of diabetes, desired ranges of glucose level fasting, after meals and with exercise. Acknowledge that pre and post exercise glucose checks will be done for 3 sessions at entry of program.   Core Components/Risk Factors/Patient Goals  Review:   Goals and Risk Factor Review     Row Name 09/07/21 0730             Core Components/Risk Factors/Patient Goals Review   Personal Goals Review Lipids;Hypertension       Review Today His BP was 124/64. BP 128-135/75-82. He did a stress test yesterday - it went well. He is currently taking all of his medications as prescribed and is not having any issues at this time.       Expected Outcomes ST: continue checking blood pressures at home LT: continue to manage risk factors                Core Components/Risk Factors/Patient Goals at Discharge (Final Review):   Goals and Risk Factor Review - 09/07/21 0730       Core Components/Risk Factors/Patient Goals Review   Personal Goals Review Lipids;Hypertension    Review Today His BP was 124/64. BP 128-135/75-82. He did a stress test yesterday - it went well. He is currently taking all of his medications as prescribed and is not having any issues at this time.    Expected Outcomes ST: continue checking blood pressures at home LT: continue to manage risk factors             ITP Comments:  ITP Comments     Row Name 08/02/21 1056 08/15/21 1210 08/21/21 1528 09/06/21 0850 09/07/21 0724   ITP Comments Virtual orientation call completed today. he has an appointment on Date: 08/15/2021  for EP eval and gym Orientation.  Documentation of diagnosis can be found in [facility]  Date: 06/12/2021 . Completed 6MWT and gym orientation. Initial ITP created and sent for review toDr. Emily Filbert, Medical Director. Patient will be participating in the hybrid program. Will attend to rehab in person 1x/month. First follow up email for patient completed and sent today for patient's cardiac rehab hybrid program. EP sent patient home exercise plan instructions including THRZ, exercise restrictions, symptoms to monitor, and how to send an exercise log going forward. EP sent [facility] Cardiac [facility] Pulmonary Youtube account as well as RPE & Dyspnea scales. Encouraged  patient to ask questions. Will follow up weekly with home exercise. 30 day review completed. ITP  sent to Dr. Emily Filbert, Medical Director of Cardiac Rehab. Continue with ITP unless changes are made by physician.  Pt is doing our hybrid program and supposed to come for monthly check in in person tomorrow. First full day of exercise!  Patient was oriented to gym and equipment including functions, settings, policies, and procedures.  Patient's individual exercise prescription and treatment plan were reviewed.  All starting workloads were established based on the results of the 6 minute walk test done at initial orientation visit.  The plan for exercise progression was also introduced and progression will be customized based on patient's performance and goals.    [place] Name 09/27/21 1232 10/04/21 0657         ITP Comments See exercise section for changes and goals.Danielle has been keeping a log of his walking at home.  Staff have reviewed and will check in next time he has in person visit. 30 Day review completed. Medical Director ITP review done, changes made as directed, and signed approval by Medical Director.               Comments:  30 Day review completed. Medical Director ITP review done, changes made as directed, and signed approval by Medical Director.

## 2021-10-09 ENCOUNTER — Encounter: Payer: Self-pay | Admitting: *Deleted

## 2021-10-09 DIAGNOSIS — Z951 Presence of aortocoronary bypass graft: Secondary | ICD-10-CM

## 2021-10-25 ENCOUNTER — Ambulatory Visit: Payer: Medicare Other

## 2021-10-25 NOTE — Progress Notes (Signed)
Adam Zavala will finish up when he comes in next and completes post 6MWT

## 2021-11-01 ENCOUNTER — Encounter: Payer: Self-pay | Admitting: *Deleted

## 2021-11-01 DIAGNOSIS — Z951 Presence of aortocoronary bypass graft: Secondary | ICD-10-CM

## 2021-11-01 NOTE — Progress Notes (Signed)
Cardiac Individual Treatment Plan  Patient Details  Name: Angelus Hoopes. MRN: [id number] Date of Birth: Sep 03, 1946 Referring Provider:   Flowsheet Row Cardiac Rehab from 08/15/2021 in [facility] Cardiac and Pulmonary Rehab  Referring Provider Serafina Royals MD       Initial Encounter Date:  Flowsheet Row Cardiac Rehab from 08/15/2021 in [facility] Cardiac and Pulmonary Rehab  Date 08/15/21       Visit Diagnosis: S/P CABG x 3 06/13/2021  Patient's Home Medications on Admission:  Current Outpatient Medications:    apixaban (ELIQUIS) 5 MG TABS tablet, Take 1 tablet (5 mg total) by mouth 2 (two) times daily., Disp: 60 tablet, Rfl: 0   apixaban (ELIQUIS) 5 MG TABS tablet, Take 1 tablet by mouth 2 (two) times daily., Disp: , Rfl:    aspirin 81 MG tablet, Take 81 mg by mouth daily., Disp: , Rfl:    atorvastatin (LIPITOR) 80 MG tablet, Take 80 mg by mouth every evening. , Disp: , Rfl:    diltiazem (CARDIZEM CD) 180 MG 24 hr capsule, Take 1 capsule (180 mg total) by mouth daily., Disp: 30 capsule, Rfl: 0   esomeprazole (NEXIUM) 40 MG capsule, Take 40 mg by mouth daily before breakfast., Disp: , Rfl:    metoprolol tartrate (LOPRESSOR) 25 MG tablet, Take 1 tablet (25 mg total) by mouth 2 (two) times daily., Disp: 60 tablet, Rfl: 0   nitroGLYCERIN (NITROSTAT) 0.4 MG SL tablet, Place 0.5 mg under the tongue as directed. (Patient not taking: Reported on 06/24/2021), Disp: , Rfl:    PRALUENT 75 MG/ML SOAJ, SMARTSIG:75 Milligram(s) SUB-Q Every 2 Weeks, Disp: , Rfl:    Tamsulosin HCl (FLOMAX) 0.4 MG CAPS, Take 0.4 mg by mouth daily., Disp: , Rfl:   Past Medical History: Past Medical History:  Diagnosis Date   A-fib ([place])    now in normal sinus rhytm   Aortic stenosis    Aortic valve replaced 03/2013   Coronary artery disease    one vessel coronary artery disease, status post DES stent placement at time of aortic valve replacement April 2014. Followed by Dr. Serafina Royals   Dysrhythmia    Elevated PSA     GERD (gastroesophageal reflux disease)    with Barretts esophagus followed by Dr. [name]   Heart attack [facility])    Heart murmur    Hyperlipidemia    Hypertension    Pneumonia    Sleep apnea     Tobacco Use: Social History   Tobacco Use  Smoking Status Former   Types: Cigars  Smokeless Tobacco Never    Labs: Recent Review Flowsheet Data   There is no flowsheet data to display.      Exercise Target Goals: Exercise Program Goal: Individual exercise prescription set using results from initial 6 min walk test and THRR while considering  patient's activity barriers and safety.   Exercise Prescription Goal: Initial exercise prescription builds to 30-45 minutes a day of aerobic activity, 2-3 days per week.  Home exercise guidelines will be given to patient during program as part of exercise prescription that the participant will acknowledge.   Education: Aerobic Exercise: - Group verbal and visual presentation on the components of exercise prescription. Introduces F.I.T.T principle from ACSM for exercise prescriptions.  Reviews F.I.T.T. principles of aerobic exercise including progression. Written material given at graduation. Flowsheet Row Cardiac Rehab from 08/15/2021 in [facility] Cardiac and Pulmonary Rehab  Education need identified 08/15/21       Education: Resistance Exercise: - Group verbal  and visual presentation on the components of exercise prescription. Introduces F.I.T.T principle from ACSM for exercise prescriptions  Reviews F.I.T.T. principles of resistance exercise including progression. Written material given at graduation.    Education: Exercise & Equipment Safety: - Individual verbal instruction and demonstration of equipment use and safety with use of the equipment. Flowsheet Row Cardiac Rehab from 08/15/2021 in [facility] Cardiac and Pulmonary Rehab  Education need identified 08/15/21  Date 08/15/21  Educator [place]  Instruction Review Code 1- Verbalizes Understanding        Education: Exercise Physiology & General Exercise Guidelines: - Group verbal and written instruction with models to review the exercise physiology of the cardiovascular system and associated critical values. Provides general exercise guidelines with specific guidelines to those with heart or lung disease.    Education: Flexibility, Balance, Mind/Body Relaxation: - Group verbal and visual presentation with interactive activity on the components of exercise prescription. Introduces F.I.T.T principle from ACSM for exercise prescriptions. Reviews F.I.T.T. principles of flexibility and balance exercise training including progression. Also discusses the mind body connection.  Reviews various relaxation techniques to help reduce and manage stress (i.e. Deep breathing, progressive muscle relaxation, and visualization). Balance handout provided to take home. Written material given at graduation.   Activity Barriers & Risk Stratification:  Activity Barriers & Cardiac Risk Stratification - 08/15/21 1356       Activity Barriers & Cardiac Risk Stratification   Activity Barriers None    Cardiac Risk Stratification High             6 Minute Walk:  6 Minute Walk     Row Name 08/15/21 1329         6 Minute Walk   Phase Initial     Distance 1460 feet     Walk Time 6 minutes     # of Rest Breaks 0     MPH 2.76     METS 2.67     RPE 9     Perceived Dyspnea  0     VO2 Peak 9.35     Symptoms No     Resting HR 58 bpm     Resting BP 128/72     Resting Oxygen Saturation  99 %     Exercise Oxygen Saturation  during 6 min walk 97 %     Max Ex. HR 92 bpm     Max Ex. BP 140/72     2 Minute Post BP 130/72              Oxygen Initial Assessment:   Oxygen Re-Evaluation:   Oxygen Discharge (Final Oxygen Re-Evaluation):   Initial Exercise Prescription:  Initial Exercise Prescription - 08/15/21 1400       Date of Initial Exercise RX and Referring Provider   Date 08/15/21     Referring Provider Serafina Royals MD      Treadmill   MPH 2.9    Grade 1    Minutes 15    METs 3.62      NuStep   Level 3    SPM 80    Minutes 15    METs 2.6      REL-XR   Level 2    Speed 50    Minutes 15    METs 2.6      Prescription Details   Frequency (times per week) Hybrid   1x/month in rehab; Home exercise provided   Duration Progress to 30 minutes of continuous aerobic without signs/symptoms of physical  distress      Intensity   THRR 40-80% of Max Heartrate 92-127    Ratings of Perceived Exertion 11-13    Perceived Dyspnea 0-4      Progression   Progression Continue to progress workloads to maintain intensity without signs/symptoms of physical distress.      Resistance Training   Training Prescription Yes    Weight 5 lb    Reps 10-15             Perform Capillary Blood Glucose checks as needed.  Exercise Prescription Changes:   Exercise Prescription Changes     Row Name 08/15/21 1400 09/11/21 0900 09/27/21 1200 10/09/21 1100 10/25/21 1400     Response to Exercise   Blood Pressure (Admit) 128/72 128/64 136/92 124/75 137/87   Blood Pressure (Exercise) 140/72 128/70 -- -- --   Blood Pressure (Exit) 130/72 124/68 125/73 124/70 141/83   Heart Rate (Admit) 58 bpm 90 bpm 50 bpm 69 bpm 58 bpm   Heart Rate (Exercise) 92 bpm 100 bpm -- -- --   Heart Rate (Exit) 63 bpm 75 bpm 105 bpm 101 bpm 114 bpm   Oxygen Saturation (Admit) 99 % -- 98 % 98 % 97 %   Oxygen Saturation (Exercise) 97 % -- 95 % 98 % 98 %   Oxygen Saturation (Exit) 99 % -- -- -- --   Rating of Perceived Exertion (Exercise) 9 13 -- -- --   Perceived Dyspnea (Exercise) 0 0 -- -- --   Symptoms none none -- -- --   Comments walk test results 1st day session in rehab self reported walking self reported walking self reported walking   Duration -- Continue with 30 min of aerobic exercise without signs/symptoms of physical distress. Continue with 30 min of aerobic exercise without signs/symptoms  of physical distress. Continue with 30 min of aerobic exercise without signs/symptoms of physical distress. Continue with 30 min of aerobic exercise without signs/symptoms of physical distress.   Intensity -- THRR unchanged THRR unchanged THRR unchanged THRR unchanged     Progression   Progression -- Continue to progress workloads to maintain intensity without signs/symptoms of physical distress. Continue to progress workloads to maintain intensity without signs/symptoms of physical distress. Continue to progress workloads to maintain intensity without signs/symptoms of physical distress. Continue to progress workloads to maintain intensity without signs/symptoms of physical distress.   Average METs -- 3.48 3.4 3.53 3.3     Resistance Training   Training Prescription -- Yes -- Yes Yes   Weight -- 5 lb -- -- --   Reps -- 10-15 -- -- --     Interval Training   Interval Training -- No -- -- --     Treadmill   MPH -- 3.2 -- -- --   Grade -- 1 -- -- --   Minutes -- 15 -- -- --   METs -- 3.89 -- -- --     REL-XR   Level -- 5 -- -- --   Minutes -- 15 -- -- --   METs -- 3.6 -- -- --     Home Exercise Plan   Plans to continue exercise at -- Home (comment)  Hybrid: walking Home (comment)  Hybrid: walking Home (comment)  Hybrid: walking Home (comment)  Hybrid: walking   Frequency -- Add 3 additional days to program exercise sessions.  Hybrid; 1 session in person/ month Add 3 additional days to program exercise sessions.  Hybrid; 1 session in person/ month Add 3  additional days to program exercise sessions.  Hybrid; 1 session in person/ month Add 3 additional days to program exercise sessions.  Hybrid; 1 session in person/ month   Initial Home Exercises Provided -- 08/21/21 08/21/21 08/21/21 08/21/21            Exercise Comments:   Exercise Comments     Row Name 09/07/21 0724           Exercise Comments First full day of exercise!  Patient was oriented to gym and equipment including  functions, settings, policies, and procedures.  Patient's individual exercise prescription and treatment plan were reviewed.  All starting workloads were established based on the results of the 6 minute walk test done at initial orientation visit.  The plan for exercise progression was also introduced and progression will be customized based on patient's performance and goals.                Exercise Goals and Review:   Exercise Goals     Row Name 08/15/21 1405             Exercise Goals   Increase Physical Activity Yes       Intervention Provide advice, education, support and counseling about physical activity/exercise needs.;Develop an individualized exercise prescription for aerobic and resistive training based on initial evaluation findings, risk stratification, comorbidities and participant's personal goals.       Expected Outcomes Short Term: Attend rehab on a regular basis to increase amount of physical activity.;Long Term: Add in home exercise to make exercise part of routine and to increase amount of physical activity.;Long Term: Exercising regularly at least 3-5 days a week.       Increase Strength and Stamina Yes       Intervention Develop an individualized exercise prescription for aerobic and resistive training based on initial evaluation findings, risk stratification, comorbidities and participant's personal goals.;Provide advice, education, support and counseling about physical activity/exercise needs.       Expected Outcomes Short Term: Increase workloads from initial exercise prescription for resistance, speed, and METs.;Short Term: Perform resistance training exercises routinely during rehab and add in resistance training at home;Long Term: Improve cardiorespiratory fitness, muscular endurance and strength as measured by increased METs and functional capacity (6MWT)       Able to understand and use rate of perceived exertion (RPE) scale Yes       Intervention Provide  education and explanation on how to use RPE scale       Expected Outcomes Short Term: Able to use RPE daily in rehab to express subjective intensity level;Long Term:  Able to use RPE to guide intensity level when exercising independently       Able to understand and use Dyspnea scale Yes       Intervention Provide education and explanation on how to use Dyspnea scale       Expected Outcomes Short Term: Able to use Dyspnea scale daily in rehab to express subjective sense of shortness of breath during exertion;Long Term: Able to use Dyspnea scale to guide intensity level when exercising independently       Knowledge and understanding of Target Heart Rate Range (THRR) Yes       Intervention Provide education and explanation of THRR including how the numbers were predicted and where they are located for reference       Expected Outcomes Long Term: Able to use THRR to govern intensity when exercising independently;Short Term: Able to state/look up THRR;Short Term: Able  to use daily as guideline for intensity in rehab       Able to check pulse independently Yes       Intervention Review the importance of being able to check your own pulse for safety during independent exercise;Provide education and demonstration on how to check pulse in carotid and radial arteries.       Expected Outcomes Short Term: Able to explain why pulse checking is important during independent exercise;Long Term: Able to check pulse independently and accurately       Understanding of Exercise Prescription Yes       Intervention Provide education, explanation, and written materials on patient's individual exercise prescription       Expected Outcomes Short Term: Able to explain program exercise prescription;Long Term: Able to explain home exercise prescription to exercise independently                Exercise Goals Re-Evaluation :  Exercise Goals Re-Evaluation     Row Name 09/07/21 0724 09/11/21 1001 09/27/21 1225 10/09/21  1129 10/25/21 1459     Exercise Goal Re-Evaluation   Exercise Goals Review Increase Physical Activity;Able to understand and use rate of perceived exertion (RPE) scale;Knowledge and understanding of Target Heart Rate Range (THRR);Understanding of Exercise Prescription;Increase Strength and Stamina;Able to understand and use Dyspnea scale;Able to check pulse independently Increase Physical Activity;Increase Strength and Stamina;Understanding of Exercise Prescription Increase Physical Activity;Increase Strength and Stamina Increase Physical Activity;Increase Strength and Stamina;Understanding of Exercise Prescription Increase Physical Activity;Increase Strength and Stamina   Comments Reviewed RPE and dyspnea scales, THR and program prescription with pt today.  Pt voiced understanding and was given a copy of goals to take home. Tom is doing well. He is participating in the hybrid [facility] program and is continuing to send in home exercise weekly. He participated in an in person cardaic rehab session on 10/6 and tolerated exercise well. His treadmill exercise prescription from the beginning increased to 3.2 speed and 1% incline. His home exercise shows walking exercise sessions almost daily, ranging from 22-34 minutes at a time. He walks for about 1.3 miles on average at an average of 3.2 mph. Patient is recording HR before & after but is not obtaining his max during exercise, which I encouraged him to write down. Will continue to follow up with patient until his next in-person visit on 11/23. Also reviewed home exercise with pt today.  Patient will be walking at home. Reviewed THR, pulse, RPE, sign and symptoms, pulse oximetery and when to call 911 or MD.  Also discussed weather considerations and indoor options.  Pt voiced understanding. Jamareon is exercising consistently according to records he sends in to staff.  He walks outside.  From his records of post HR,he is working in correct THR range.  He would  benefit from adding some strength work and stretching to his routine.  Staff will follow up with him about strength and stretching next session. Kraig continues to submit via email.  He is up to 3.3 mph on the treadmill now.  He will graduate when he attends this month to wrap up his 12 weeks. Doyt will finish up when he comes in next and completes post 6MWT.  We expect to see improvement   Expected Outcomes Short: Use RPE daily to regulate intensity. Long: Follow program prescription in THR. Short: Continue home exercise and continue to hit THR Long: Continue to exercise independently at home at appropriate prescription Short: continue consistent cardio and add stretching Long:  build overall stamina Short: continue to exercise and attend for last visit on 11/23.  Long; Continue to exercise independently ST: complete hybrid program Long: maintain exercise on his own            Discharge Exercise Prescription (Final Exercise Prescription Changes):  Exercise Prescription Changes - 10/25/21 1400       Response to Exercise   Blood Pressure (Admit) 137/87    Blood Pressure (Exit) 141/83    Heart Rate (Admit) 58 bpm    Heart Rate (Exit) 114 bpm    Oxygen Saturation (Admit) 97 %    Oxygen Saturation (Exercise) 98 %    Comments self reported walking    Duration Continue with 30 min of aerobic exercise without signs/symptoms of physical distress.    Intensity THRR unchanged      Progression   Progression Continue to progress workloads to maintain intensity without signs/symptoms of physical distress.    Average METs 3.3      Resistance Training   Training Prescription Yes      Home Exercise Plan   Plans to continue exercise at Home (comment)   Hybrid: walking   Frequency Add 3 additional days to program exercise sessions.   Hybrid; 1 session in person/ month   Initial Home Exercises Provided 08/21/21             Nutrition:  Target Goals: Understanding of nutrition guidelines,  daily intake of sodium 1500mg , cholesterol 200mg , calories 30% from fat and 7% or less from saturated fats, daily to have 5 or more servings of fruits and vegetables.  Education: All About Nutrition: -Group instruction provided by verbal, written material, interactive activities, discussions, models, and posters to present general guidelines for heart healthy nutrition including fat, fiber, MyPlate, the role of sodium in heart healthy nutrition, utilization of the nutrition label, and utilization of this knowledge for meal planning. Follow up email sent as well. Written material given at graduation. Flowsheet Row Cardiac Rehab from 08/15/2021 in [facility] Cardiac and Pulmonary Rehab  Education need identified 08/15/21       Biometrics:  Pre Biometrics - 08/15/21 1356       Pre Biometrics   Height 6' 1.2" (1.859 m)    Weight 243 lb 12.8 oz (110.6 kg)    BMI (Calculated) 32    Single Leg Stand 30 seconds              Nutrition Therapy Plan and Nutrition Goals:  Nutrition Therapy & Goals - 09/07/21 0736       Nutrition Therapy   Diet Heart healthy, low Na    Drug/Food Interactions Statins/Certain Fruits    Protein (specify units) 85g    Fiber 30 grams    Whole Grain Foods 3 servings    Saturated Fats 12 max. grams    Fruits and Vegetables 8 servings/day    Sodium 1.5 grams      Personal Nutrition Goals   Nutrition Goal ST:LT: continue with current diet, no goals at this time (goal making sheet provided)    Comments Jamee reports doing well with his diet. His PYP was 83, a good score is >60. Unable to get a full food recall; he limits processed foods, only uses salt on tomato sandwiches, does not eat any high fat dairy products, eats mostly chicken and fish for his protein sources. He reports enjoying his food. Ogden reports having a good understanding of heart healthy eating, reviewed specifics and numbers. Provided handouts.  Intervention Plan   Intervention Prescribe,  educate and counsel regarding individualized specific dietary modifications aiming towards targeted core components such as weight, hypertension, lipid management, diabetes, heart failure and other comorbidities.;Nutrition handout(s) given to patient.    Expected Outcomes Short Term Goal: Understand basic principles of dietary content, such as calories, fat, sodium, cholesterol and nutrients.;Short Term Goal: A plan has been developed with personal nutrition goals set during dietitian appointment.;Long Term Goal: Adherence to prescribed nutrition plan.             Nutrition Assessments:  MEDIFICTS Score Key: ?70 Need to make dietary changes  40-70 Heart Healthy Diet ? 40 Therapeutic Level Cholesterol Diet  Flowsheet Row Cardiac Rehab from 08/15/2021 in [facility] Cardiac and Pulmonary Rehab  Picture Your Plate Total Score on Admission 83      Picture Your Plate Scores: <09 Unhealthy dietary pattern with much room for improvement. 41-50 Dietary pattern unlikely to meet recommendations for good health and room for improvement. 51-60 More healthful dietary pattern, with some room for improvement.  >60 Healthy dietary pattern, although there may be some specific behaviors that could be improved.    Nutrition Goals Re-Evaluation:   Nutrition Goals Discharge (Final Nutrition Goals Re-Evaluation):   Psychosocial: Target Goals: Acknowledge presence or absence of significant depression and/or stress, maximize coping skills, provide positive support system. Participant is able to verbalize types and ability to use techniques and skills needed for reducing stress and depression.   Education: Stress, Anxiety, and Depression - Group verbal and visual presentation to define topics covered.  Reviews how body is impacted by stress, anxiety, and depression.  Also discusses healthy ways to reduce stress and to treat/manage anxiety and depression.  Written material given at graduation.   Education:  Sleep Hygiene -Provides group verbal and written instruction about how sleep can affect your health.  Define sleep hygiene, discuss sleep cycles and impact of sleep habits. Review good sleep hygiene tips.    Initial Review & Psychosocial Screening:  Initial Psych Review & Screening - 08/02/21 1039       Initial Review   Current issues with None Identified      Family Dynamics   Good Support System? Yes   wife, grandchildren,     Barriers   Psychosocial barriers to participate in program There are no identifiable barriers or psychosocial needs.      Screening Interventions   Interventions Encouraged to exercise    Expected Outcomes Short Term goal: Utilizing psychosocial counselor, staff and physician to assist with identification of specific Stressors or current issues interfering with healing process. Setting desired goal for each stressor or current issue identified.;Long Term Goal: Stressors or current issues are controlled or eliminated.;Short Term goal: Identification and review with participant of any Quality of Life or Depression concerns found by scoring the questionnaire.;Long Term goal: The participant improves quality of Life and PHQ9 Scores as seen by post scores and/or verbalization of changes             Quality of Life Scores:   Quality of Life - 10/24/21 0831       Quality of Life Scores   Health/Function Post 29.33 %    Socioeconomic Post 25.5 %    Psych/Spiritual Post 30 %    Family Post 30 %    GLOBAL Post 28.69 %            Scores of 19 and below usually indicate a poorer quality of life in these areas.  A difference of  2-3 points is a clinically meaningful difference.  A difference of 2-3 points in the total score of the Quality of Life Index has been associated with significant improvement in overall quality of life, self-image, physical symptoms, and general health in studies assessing change in quality of life.  PHQ-9: Recent Review Flowsheet  Data     Depression screen [facility] 2/9 08/15/2021   Decreased Interest 0   Down, Depressed, Hopeless 0   PHQ - 2 Score 0   Altered sleeping 0   Tired, decreased energy 1   Change in appetite 0   Feeling bad or failure about yourself  0   Trouble concentrating 0   Moving slowly or fidgety/restless 0   Suicidal thoughts 0   PHQ-9 Score 1      Interpretation of Total Score  Total Score Depression Severity:  1-4 = Minimal depression, 5-9 = Mild depression, 10-14 = Moderate depression, 15-19 = Moderately severe depression, 20-27 = Severe depression   Psychosocial Evaluation and Intervention:  Psychosocial Evaluation - 08/02/21 1048       Psychosocial Evaluation & Interventions   Interventions Encouraged to exercise with the program and follow exercise prescription    Comments Omarri has no barriers to attending the program. He is retired and continues to [employer] with his company. He travels frequesntly  and stays active. daily walks, to hunting and other activities. His wife and family and the employees of his company are his support team.  He expresses no stress and he is thankful for every day he sees the sun.  He will work on the program as a hybrid participnat,and will review the session schedule with the EP during his Exercise evaluation and gym orientation.    Expected Outcomes STG: Roth will continue to grow with his knowledge of cardiovascular disease and how to manage his risk factors.  LTG: Terril will continue to progress his exercise and work on keeping his risk factors under control.    Continue Psychosocial Services  Follow up required by staff             Psychosocial Re-Evaluation:  Psychosocial Re-Evaluation     [place] Name 09/07/21 0734             Psychosocial Re-Evaluation   Current issues with None Identified       Comments Pancho reports no stress at this time. is grateful to be alive and stays positive. He has a great support system in his family. He reports  sleeping "like a log" and he goes to be at 9 and wakes up at 5.       Expected Outcomes ST: continue to use family for support LT: maintain positive attitude       Interventions Encouraged to attend Cardiac Rehabilitation for the exercise       Continue Psychosocial Services  Follow up required by staff                Psychosocial Discharge (Final Psychosocial Re-Evaluation):  Psychosocial Re-Evaluation - 09/07/21 0734       Psychosocial Re-Evaluation   Current issues with None Identified    Comments Cameren reports no stress at this time. is grateful to be alive and stays positive. He has a great support system in his family. He reports sleeping "like a log" and he goes to be at 9 and wakes up at 5.    Expected Outcomes ST: continue to use family for support LT: maintain positive attitude  Interventions Encouraged to attend Cardiac Rehabilitation for the exercise    Continue Psychosocial Services  Follow up required by staff             Vocational Rehabilitation: Provide vocational rehab assistance to qualifying candidates.   Vocational Rehab Evaluation & Intervention:   Education: Education Goals: Education classes will be provided on a variety of topics geared toward better understanding of heart health and risk factor modification. Participant will state understanding/return demonstration of topics presented as noted by education test scores.  Learning Barriers/Preferences:   General Cardiac Education Topics:  AED/CPR: - Group verbal and written instruction with the use of models to demonstrate the basic use of the AED with the basic ABC's of resuscitation.   Anatomy and Cardiac Procedures: - Group verbal and visual presentation and models provide information about basic cardiac anatomy and function. Reviews the testing methods done to diagnose heart disease and the outcomes of the test results. Describes the treatment choices: Medical Management, Angioplasty, or  Coronary Bypass Surgery for treating various heart conditions including Myocardial Infarction, Angina, Valve Disease, and Cardiac Arrhythmias.  Written material given at graduation.   Medication Safety: - Group verbal and visual instruction to review commonly prescribed medications for heart and lung disease. Reviews the medication, class of the drug, and side effects. Includes the steps to properly store meds and maintain the prescription regimen.  Written material given at graduation.   Intimacy: - Group verbal instruction through game format to discuss how heart and lung disease can affect sexual intimacy. Written material given at graduation..   Know Your Numbers and Heart Failure: - Group verbal and visual instruction to discuss disease risk factors for cardiac and pulmonary disease and treatment options.  Reviews associated critical values for Overweight/Obesity, Hypertension, Cholesterol, and Diabetes.  Discusses basics of heart failure: signs/symptoms and treatments.  Introduces Heart Failure Zone chart for action plan for heart failure.  Written material given at graduation.   Infection Prevention: - Provides verbal and written material to individual with discussion of infection control including proper hand washing and proper equipment cleaning during exercise session. Flowsheet Row Cardiac Rehab from 08/15/2021 in [facility] Cardiac and Pulmonary Rehab  Education need identified 08/15/21  Date 08/15/21  Educator [place]  Instruction Review Code 1- Verbalizes Understanding       Falls Prevention: - Provides verbal and written material to individual with discussion of falls prevention and safety. Flowsheet Row Cardiac Rehab from 08/15/2021 in [facility] Cardiac [facility] Pulmonary Rehab  Education need identified 08/15/21  Date 08/15/21  Educator [place]  Instruction Review Code 1- Verbalizes Understanding       Other: -Provides group and verbal instruction on various topics (see  comments)   Knowledge Questionnaire Score:  Knowledge Questionnaire Score - 10/24/21 7673       Knowledge Questionnaire Score   Post Score 23/26: Nutrition, MI, Exercise             Core Components/Risk Factors/Patient Goals at Admission:  Personal Goals and Risk Factors at Admission - 08/15/21 1418       Core Components/Risk Factors/Patient Goals on Admission    Weight Management Yes;Weight Loss    Intervention Weight Management: Develop a combined nutrition and exercise program designed to reach desired caloric intake, while maintaining appropriate intake of nutrient and fiber, sodium and fats, and appropriate energy expenditure required for the weight goal.;Weight Management: Provide education and appropriate resources to help participant work on and attain dietary goals.;Weight Management/Obesity: Establish reasonable short term and  long term weight goals.    Admit Weight 243 lb (110.2 kg)    Goal Weight: Short Term 239 lb (108.4 kg)    Goal Weight: Long Term 233 lb (105.7 kg)    Expected Outcomes Short Term: Continue to assess and modify interventions until short term weight is achieved;Weight Maintenance: Understanding of the daily nutrition guidelines, which includes 25-35% calories from fat, 7% or less cal from saturated fats, less than 200mg  cholesterol, less than 1.5gm of sodium, & 5 or more servings of fruits and vegetables daily;Weight Loss: Understanding of general recommendations for a balanced deficit meal plan, which promotes 1-2 lb weight loss per week and includes a negative energy balance of [phone] kcal/d;Understanding recommendations for meals to include 15-35% energy as protein, 25-35% energy from fat, 35-60% energy from carbohydrates, less than 200mg  of dietary cholesterol, 20-35 gm of total fiber daily;Understanding of distribution of calorie intake throughout the day with the consumption of 4-5 meals/snacks    Hypertension Yes    Intervention Provide education on  lifestyle modifcations including regular physical activity/exercise, weight management, moderate sodium restriction and increased consumption of fresh fruit, vegetables, and low fat dairy, alcohol moderation, and smoking cessation.;Monitor prescription use compliance.    Expected Outcomes Short Term: Continued assessment and intervention until BP is < 140/69mm HG in hypertensive participants. < 130/20mm HG in hypertensive participants with diabetes, heart failure or chronic kidney disease.;Long Term: Maintenance of blood pressure at goal levels.    Lipids Yes    Intervention Provide education and support for participant on nutrition & aerobic/resistive exercise along with prescribed medications to achieve LDL 70mg , HDL >40mg .    Expected Outcomes Short Term: Participant states understanding of desired cholesterol values and is compliant with medications prescribed. Participant is following exercise prescription and nutrition guidelines.;Long Term: Cholesterol controlled with medications as prescribed, with individualized exercise RX and with personalized nutrition plan. Value goals: LDL < 70mg , HDL > 40 mg.             Education:Diabetes - Individual verbal and written instruction to review signs/symptoms of diabetes, desired ranges of glucose level fasting, after meals and with exercise. Acknowledge that pre and post exercise glucose checks will be done for 3 sessions at entry of program.   Core Components/Risk Factors/Patient Goals Review:   Goals and Risk Factor Review     Row Name 09/07/21 0730             Core Components/Risk Factors/Patient Goals Review   Personal Goals Review Lipids;Hypertension       Review Today His BP was 124/64. BP 128-135/75-82. He did a stress test yesterday - it went well. He is currently taking all of his medications as prescribed and is not having any issues at this time.       Expected Outcomes ST: continue checking blood pressures at home LT: continue  to manage risk factors                Core Components/Risk Factors/Patient Goals at Discharge (Final Review):   Goals and Risk Factor Review - 09/07/21 0730       Core Components/Risk Factors/Patient Goals Review   Personal Goals Review Lipids;Hypertension    Review Today His BP was 124/64. BP 128-135/75-82. He did a stress test yesterday - it went well. He is currently taking all of his medications as prescribed and is not having any issues at this time.    Expected Outcomes ST: continue checking blood pressures at home LT: continue to  manage risk factors             ITP Comments:  ITP Comments     Row Name 08/02/21 1056 08/15/21 1210 08/21/21 1528 09/06/21 0850 09/07/21 0724   ITP Comments Virtual orientation call completed today. he has an appointment on Date: 08/15/2021  for EP eval and gym Orientation.  Documentation of diagnosis can be found in [facility]  Date: 06/12/2021 . Completed 6MWT and gym orientation. Initial ITP created and sent for review toDr. Emily Filbert, Medical Director. Patient will be participating in the hybrid program. Will attend to rehab in person 1x/month. First follow up email for patient completed and sent today for patient's cardiac rehab hybrid program. EP sent patient home exercise plan instructions including THRZ, exercise restrictions, symptoms to monitor, and how to send an exercise log going forward. EP sent [name] Cardiac and Pulmonary Youtube account as well as RPE & Dyspnea scales. Encouraged patient to ask questions. Will follow up weekly with home exercise. 30 day review completed. ITP sent to Dr. Emily Filbert, Medical Director of Cardiac Rehab. Continue with ITP unless changes are made by physician.  Pt is doing our hybrid program and supposed to come for monthly check in in person tomorrow. First full day of exercise!  Patient was oriented to gym and equipment including functions, settings, policies, and procedures.  Patient's individual exercise  prescription and treatment plan were reviewed.  All starting workloads were established based on the results of the 6 minute walk test done at initial orientation visit.  The plan for exercise progression was also introduced and progression will be customized based on patient's performance and goals.    [place] Name 09/27/21 1232 10/04/21 0657 11/01/21 0643       ITP Comments See exercise section for changes and goals.Bohdan has been keeping a log of his walking at home.  Staff have reviewed and will check in next time he has in person visit. 30 Day review completed. Medical Director ITP review done, changes made as directed, and signed approval by Medical Director. 30 Day review completed. Medical Director ITP review done, changes made as directed, and signed approval by Medical Director.              Comments:

## 2021-11-06 DIAGNOSIS — Z951 Presence of aortocoronary bypass graft: Secondary | ICD-10-CM

## 2021-11-09 ENCOUNTER — Other Ambulatory Visit: Payer: Self-pay

## 2021-11-09 ENCOUNTER — Encounter: Payer: Medicare Other | Attending: Internal Medicine

## 2021-11-09 VITALS — Ht 73.2 in | Wt 244.7 lb

## 2021-11-09 DIAGNOSIS — Z951 Presence of aortocoronary bypass graft: Secondary | ICD-10-CM | POA: Insufficient documentation

## 2021-11-09 NOTE — Progress Notes (Signed)
Cardiac Individual Treatment Plan  Patient Details  Name: Adam Zavala. MRN: [id number] Date of Birth: Apr 07, 1946 Referring Provider:   Flowsheet Row Cardiac Rehab from 08/15/2021 in [facility] Cardiac and Pulmonary Rehab  Referring Provider Serafina Royals MD       Initial Encounter Date:  Flowsheet Row Cardiac Rehab from 08/15/2021 in [facility] Cardiac and Pulmonary Rehab  Date 08/15/21       Visit Diagnosis: S/P CABG x 3 06/13/2021  Patient's Home Medications on Admission:  Current Outpatient Medications:    apixaban (ELIQUIS) 5 MG TABS tablet, Take 1 tablet (5 mg total) by mouth 2 (two) times daily., Disp: 60 tablet, Rfl: 0   apixaban (ELIQUIS) 5 MG TABS tablet, Take 1 tablet by mouth 2 (two) times daily., Disp: , Rfl:    aspirin 81 MG tablet, Take 81 mg by mouth daily., Disp: , Rfl:    atorvastatin (LIPITOR) 80 MG tablet, Take 80 mg by mouth every evening. , Disp: , Rfl:    diltiazem (CARDIZEM CD) 180 MG 24 hr capsule, Take 1 capsule (180 mg total) by mouth daily., Disp: 30 capsule, Rfl: 0   esomeprazole (NEXIUM) 40 MG capsule, Take 40 mg by mouth daily before breakfast., Disp: , Rfl:    metoprolol tartrate (LOPRESSOR) 25 MG tablet, Take 1 tablet (25 mg total) by mouth 2 (two) times daily., Disp: 60 tablet, Rfl: 0   nitroGLYCERIN (NITROSTAT) 0.4 MG SL tablet, Place 0.5 mg under the tongue as directed. (Patient not taking: Reported on 06/24/2021), Disp: , Rfl:    PRALUENT 75 MG/ML SOAJ, SMARTSIG:75 Milligram(s) SUB-Q Every 2 Weeks, Disp: , Rfl:    Tamsulosin HCl (FLOMAX) 0.4 MG CAPS, Take 0.4 mg by mouth daily., Disp: , Rfl:   Past Medical History: Past Medical History:  Diagnosis Date   A-fib ([place])    now in normal sinus rhytm   Aortic stenosis    Aortic valve replaced 03/2013   Coronary artery disease    one vessel coronary artery disease, status post DES stent placement at time of aortic valve replacement April 2014. Followed by Dr. Serafina Royals   Dysrhythmia    Elevated PSA     GERD (gastroesophageal reflux disease)    with Barretts esophagus followed by Dr. [name]   Heart attack [facility])    Heart murmur    Hyperlipidemia    Hypertension    Pneumonia    Sleep apnea     Tobacco Use: Social History   Tobacco Use  Smoking Status Former   Types: Cigars  Smokeless Tobacco Never    Labs: Recent Review Flowsheet Data   There is no flowsheet data to display.      Exercise Target Goals: Exercise Program Goal: Individual exercise prescription set using results from initial 6 min walk test and THRR while considering  patient's activity barriers and safety.   Exercise Prescription Goal: Initial exercise prescription builds to 30-45 minutes a day of aerobic activity, 2-3 days per week.  Home exercise guidelines will be given to patient during program as part of exercise prescription that the participant will acknowledge.   Education: Aerobic Exercise: - Group verbal and visual presentation on the components of exercise prescription. Introduces F.I.T.T principle from ACSM for exercise prescriptions.  Reviews F.I.T.T. principles of aerobic exercise including progression. Written material given at graduation. Flowsheet Row Cardiac Rehab from 08/15/2021 in [facility] Cardiac and Pulmonary Rehab  Education need identified 08/15/21       Education: Resistance Exercise: - Group verbal  and visual presentation on the components of exercise prescription. Introduces F.I.T.T principle from ACSM for exercise prescriptions  Reviews F.I.T.T. principles of resistance exercise including progression. Written material given at graduation.    Education: Exercise & Equipment Safety: - Individual verbal instruction and demonstration of equipment use and safety with use of the equipment. Flowsheet Row Cardiac Rehab from 08/15/2021 in [facility] Cardiac and Pulmonary Rehab  Education need identified 08/15/21  Date 08/15/21  Educator [place]  Instruction Review Code 1- Verbalizes Understanding        Education: Exercise Physiology & General Exercise Guidelines: - Group verbal and written instruction with models to review the exercise physiology of the cardiovascular system and associated critical values. Provides general exercise guidelines with specific guidelines to those with heart or lung disease.    Education: Flexibility, Balance, Mind/Body Relaxation: - Group verbal and visual presentation with interactive activity on the components of exercise prescription. Introduces F.I.T.T principle from ACSM for exercise prescriptions. Reviews F.I.T.T. principles of flexibility and balance exercise training including progression. Also discusses the mind body connection.  Reviews various relaxation techniques to help reduce and manage stress (i.e. Deep breathing, progressive muscle relaxation, and visualization). Balance handout provided to take home. Written material given at graduation.   Activity Barriers & Risk Stratification:  Activity Barriers & Cardiac Risk Stratification - 08/15/21 1356       Activity Barriers & Cardiac Risk Stratification   Activity Barriers None    Cardiac Risk Stratification High             6 Minute Walk:  6 Minute Walk     Row Name 08/15/21 1329 11/09/21 0750       6 Minute Walk   Phase Initial Discharge    Distance 1460 feet 1700 feet    Distance % Change -- 16 %    Distance Feet Change -- 240 ft    Walk Time 6 minutes 6 minutes    # of Rest Breaks 0 0    MPH 2.76 3.22    METS 2.67 2.85    RPE 9 11    Perceived Dyspnea  0 0    VO2 Peak 9.35 9.98    Symptoms No No    Resting HR 58 bpm 70 bpm    Resting BP 128/72 110/62    Resting Oxygen Saturation  99 % --    Exercise Oxygen Saturation  during 6 min walk 97 % --    Max Ex. HR 92 bpm 86 bpm    Max Ex. BP 140/72 138/82    2 Minute Post BP 130/72 --             Oxygen Initial Assessment:   Oxygen Re-Evaluation:   Oxygen Discharge (Final Oxygen  Re-Evaluation):   Initial Exercise Prescription:  Initial Exercise Prescription - 08/15/21 1400       Date of Initial Exercise RX and Referring Provider   Date 08/15/21    Referring Provider Serafina Royals MD      Treadmill   MPH 2.9    Grade 1    Minutes 15    METs 3.62      NuStep   Level 3    SPM 80    Minutes 15    METs 2.6      REL-XR   Level 2    Speed 50    Minutes 15    METs 2.6      Prescription Details   Frequency (times per week) Hybrid  1x/month in rehab; Home exercise provided   Duration Progress to 30 minutes of continuous aerobic without signs/symptoms of physical distress      Intensity   THRR 40-80% of Max Heartrate 92-127    Ratings of Perceived Exertion 11-13    Perceived Dyspnea 0-4      Progression   Progression Continue to progress workloads to maintain intensity without signs/symptoms of physical distress.      Resistance Training   Training Prescription Yes    Weight 5 lb    Reps 10-15             Perform Capillary Blood Glucose checks as needed.  Exercise Prescription Changes:   Exercise Prescription Changes     Row Name 08/15/21 1400 09/11/21 0900 09/27/21 1200 10/09/21 1100 10/25/21 1400     Response to Exercise   Blood Pressure (Admit) 128/72 128/64 136/92 124/75 137/87   Blood Pressure (Exercise) 140/72 128/70 -- -- --   Blood Pressure (Exit) 130/72 124/68 125/73 124/70 141/83   Heart Rate (Admit) 58 bpm 90 bpm 50 bpm 69 bpm 58 bpm   Heart Rate (Exercise) 92 bpm 100 bpm -- -- --   Heart Rate (Exit) 63 bpm 75 bpm 105 bpm 101 bpm 114 bpm   Oxygen Saturation (Admit) 99 % -- 98 % 98 % 97 %   Oxygen Saturation (Exercise) 97 % -- 95 % 98 % 98 %   Oxygen Saturation (Exit) 99 % -- -- -- --   Rating of Perceived Exertion (Exercise) 9 13 -- -- --   Perceived Dyspnea (Exercise) 0 0 -- -- --   Symptoms none none -- -- --   Comments walk test results 1st day session in rehab self reported walking self reported walking self  reported walking   Duration -- Continue with 30 min of aerobic exercise without signs/symptoms of physical distress. Continue with 30 min of aerobic exercise without signs/symptoms of physical distress. Continue with 30 min of aerobic exercise without signs/symptoms of physical distress. Continue with 30 min of aerobic exercise without signs/symptoms of physical distress.   Intensity -- THRR unchanged THRR unchanged THRR unchanged THRR unchanged     Progression   Progression -- Continue to progress workloads to maintain intensity without signs/symptoms of physical distress. Continue to progress workloads to maintain intensity without signs/symptoms of physical distress. Continue to progress workloads to maintain intensity without signs/symptoms of physical distress. Continue to progress workloads to maintain intensity without signs/symptoms of physical distress.   Average METs -- 3.48 3.4 3.53 3.3     Resistance Training   Training Prescription -- Yes -- Yes Yes   Weight -- 5 lb -- -- --   Reps -- 10-15 -- -- --     Interval Training   Interval Training -- No -- -- --     Treadmill   MPH -- 3.2 -- -- --   Grade -- 1 -- -- --   Minutes -- 15 -- -- --   METs -- 3.89 -- -- --     REL-XR   Level -- 5 -- -- --   Minutes -- 15 -- -- --   METs -- 3.6 -- -- --     Home Exercise Plan   Plans to continue exercise at -- Home (comment)  Hybrid: walking Home (comment)  Hybrid: walking Home (comment)  Hybrid: walking Home (comment)  Hybrid: walking   Frequency -- Add 3 additional days to program exercise sessions.  Hybrid; 1 session  in person/ month Add 3 additional days to program exercise sessions.  Hybrid; 1 session in person/ month Add 3 additional days to program exercise sessions.  Hybrid; 1 session in person/ month Add 3 additional days to program exercise sessions.  Hybrid; 1 session in person/ month   Initial Home Exercises Provided -- 08/21/21 08/21/21 08/21/21 08/21/21             Exercise Comments:   Exercise Comments     Row Name 09/07/21 0724 11/09/21 0725         Exercise Comments First full day of exercise!  Patient was oriented to gym and equipment including functions, settings, policies, and procedures.  Patient's individual exercise prescription and treatment plan were reviewed.  All starting workloads were established based on the results of the 6 minute walk test done at initial orientation visit.  The plan for exercise progression was also introduced and progression will be customized based on patient's performance and goals. Adam Zavala graduated today from  rehab with 3 (hybrid) sessions completed.  Details of the patient's exercise prescription and what He needs to do in order to continue the prescription and progress were discussed with patient.  Patient was given a copy of prescription and goals.  Patient verbalized understanding.  Adam Zavala plans to continue to exercise by exercising at home.               Exercise Goals and Review:   Exercise Goals     Row Name 08/15/21 1405             Exercise Goals   Increase Physical Activity Yes       Intervention Provide advice, education, support and counseling about physical activity/exercise needs.;Develop an individualized exercise prescription for aerobic and resistive training based on initial evaluation findings, risk stratification, comorbidities and participant's personal goals.       Expected Outcomes Short Term: Attend rehab on a regular basis to increase amount of physical activity.;Long Term: Add in home exercise to make exercise part of routine and to increase amount of physical activity.;Long Term: Exercising regularly at least 3-5 days a week.       Increase Strength and Stamina Yes       Intervention Develop an individualized exercise prescription for aerobic and resistive training based on initial evaluation findings, risk stratification, comorbidities and participant's personal  goals.;Provide advice, education, support and counseling about physical activity/exercise needs.       Expected Outcomes Short Term: Increase workloads from initial exercise prescription for resistance, speed, and METs.;Short Term: Perform resistance training exercises routinely during rehab and add in resistance training at home;Long Term: Improve cardiorespiratory fitness, muscular endurance and strength as measured by increased METs and functional capacity (6MWT)       Able to understand and use rate of perceived exertion (RPE) scale Yes       Intervention Provide education and explanation on how to use RPE scale       Expected Outcomes Short Term: Able to use RPE daily in rehab to express subjective intensity level;Long Term:  Able to use RPE to guide intensity level when exercising independently       Able to understand and use Dyspnea scale Yes       Intervention Provide education and explanation on how to use Dyspnea scale       Expected Outcomes Short Term: Able to use Dyspnea scale daily in rehab to express subjective sense of shortness of breath during exertion;Long Term: Able to  use Dyspnea scale to guide intensity level when exercising independently       Knowledge and understanding of Target Heart Rate Range (THRR) Yes       Intervention Provide education and explanation of THRR including how the numbers were predicted and where they are located for reference       Expected Outcomes Long Term: Able to use THRR to govern intensity when exercising independently;Short Term: Able to state/look up THRR;Short Term: Able to use daily as guideline for intensity in rehab       Able to check pulse independently Yes       Intervention Review the importance of being able to check your own pulse for safety during independent exercise;Provide education and demonstration on how to check pulse in carotid and radial arteries.       Expected Outcomes Short Term: Able to explain why pulse checking is  important during independent exercise;Long Term: Able to check pulse independently and accurately       Understanding of Exercise Prescription Yes       Intervention Provide education, explanation, and written materials on patient's individual exercise prescription       Expected Outcomes Short Term: Able to explain program exercise prescription;Long Term: Able to explain home exercise prescription to exercise independently                Exercise Goals Re-Evaluation :  Exercise Goals Re-Evaluation     Row Name 09/07/21 0724 09/11/21 1001 09/27/21 1225 10/09/21 1129 10/25/21 1459     Exercise Goal Re-Evaluation   Exercise Goals Review Increase Physical Activity;Able to understand and use rate of perceived exertion (RPE) scale;Knowledge and understanding of Target Heart Rate Range (THRR);Understanding of Exercise Prescription;Increase Strength and Stamina;Able to understand and use Dyspnea scale;Able to check pulse independently Increase Physical Activity;Increase Strength and Stamina;Understanding of Exercise Prescription Increase Physical Activity;Increase Strength and Stamina Increase Physical Activity;Increase Strength and Stamina;Understanding of Exercise Prescription Increase Physical Activity;Increase Strength and Stamina   Comments Reviewed RPE and dyspnea scales, THR and program prescription with pt today.  Pt voiced understanding and was given a copy of goals to take home. Adam Zavala is doing well. He is participating in the hybrid [facility] program and is continuing to send in home exercise weekly. He participated in an in person cardaic rehab session on 10/6 and tolerated exercise well. His treadmill exercise prescription from the beginning increased to 3.2 speed and 1% incline. His home exercise shows walking exercise sessions almost daily, ranging from 22-34 minutes at a time. He walks for about 1.3 miles on average at an average of 3.2 mph. Patient is recording HR before & after but is  not obtaining his max during exercise, which I encouraged him to write down. Will continue to follow up with patient until his next in-person visit on 11/23. Also reviewed home exercise with pt today.  Patient will be walking at home. Reviewed THR, pulse, RPE, sign and symptoms, pulse oximetery and when to call 911 or MD.  Also discussed weather considerations and indoor options.  Pt voiced understanding. Adam Zavala is exercising consistently according to records he sends in to staff.  He walks outside.  From his records of post HR,he is working in correct THR range.  He would benefit from adding some strength work and stretching to his routine.  Staff will follow up with him about strength and stretching next session. Adam Zavala continues to submit via email.  He is up to 3.3 mph on the treadmill  now.  He will graduate when he attends this month to wrap up his 12 weeks. Adam Zavala will finish up when he comes in next and completes post 6MWT.  We expect to see improvement   Expected Outcomes Short: Use RPE daily to regulate intensity. Long: Follow program prescription in THR. Short: Continue home exercise and continue to hit THR Long: Continue to exercise independently at home at appropriate prescription Short: continue consistent cardio and add stretching Long:  build overall stamina Short: continue to exercise and attend for last visit on 11/23.  Long; Continue to exercise independently ST: complete hybrid program Long: maintain exercise on his own            Discharge Exercise Prescription (Final Exercise Prescription Changes):  Exercise Prescription Changes - 10/25/21 1400       Response to Exercise   Blood Pressure (Admit) 137/87    Blood Pressure (Exit) 141/83    Heart Rate (Admit) 58 bpm    Heart Rate (Exit) 114 bpm    Oxygen Saturation (Admit) 97 %    Oxygen Saturation (Exercise) 98 %    Comments self reported walking    Duration Continue with 30 min of aerobic exercise without signs/symptoms of  physical distress.    Intensity THRR unchanged      Progression   Progression Continue to progress workloads to maintain intensity without signs/symptoms of physical distress.    Average METs 3.3      Resistance Training   Training Prescription Yes      Home Exercise Plan   Plans to continue exercise at Home (comment)   Hybrid: walking   Frequency Add 3 additional days to program exercise sessions.   Hybrid; 1 session in person/ month   Initial Home Exercises Provided 08/21/21             Nutrition:  Target Goals: Understanding of nutrition guidelines, daily intake of sodium 1500mg , cholesterol 200mg , calories 30% from fat and 7% or less from saturated fats, daily to have 5 or more servings of fruits and vegetables.  Education: All About Nutrition: -Group instruction provided by verbal, written material, interactive activities, discussions, models, and posters to present general guidelines for heart healthy nutrition including fat, fiber, MyPlate, the role of sodium in heart healthy nutrition, utilization of the nutrition label, and utilization of this knowledge for meal planning. Follow up email sent as well. Written material given at graduation. Flowsheet Row Cardiac Rehab from 08/15/2021 in [facility] Cardiac and Pulmonary Rehab  Education need identified 08/15/21       Biometrics:  Pre Biometrics - 08/15/21 1356       Pre Biometrics   Height 6' 1.2" (1.859 m)    Weight 243 lb 12.8 oz (110.6 kg)    BMI (Calculated) 32    Single Leg Stand 30 seconds             Post Biometrics - 11/09/21 0753        Post  Biometrics   Height 6' 1.2" (1.859 m)    Weight 244 lb 11.2 oz (111 kg)    BMI (Calculated) 32.12    Single Leg Stand 30 seconds             Nutrition Therapy Plan and Nutrition Goals:  Nutrition Therapy & Goals - 09/07/21 0736       Nutrition Therapy   Diet Heart healthy, low Na    Drug/Food Interactions Statins/Certain Fruits    Protein (specify  units) 85g  Fiber 30 grams    Whole Grain Foods 3 servings    Saturated Fats 12 max. grams    Fruits and Vegetables 8 servings/day    Sodium 1.5 grams      Personal Nutrition Goals   Nutrition Goal ST:LT: continue with current diet, no goals at this time (goal making sheet provided)    Comments Adam Zavala reports doing well with his diet. His PYP was 83, a good score is >60. Unable to get a full food recall; he limits processed foods, only uses salt on tomato sandwiches, does not eat any high fat dairy products, eats mostly chicken and fish for his protein sources. He reports enjoying his food. Adam Zavala reports having a good understanding of heart healthy eating, reviewed specifics and numbers. Provided handouts.      Intervention Plan   Intervention Prescribe, educate and counsel regarding individualized specific dietary modifications aiming towards targeted core components such as weight, hypertension, lipid management, diabetes, heart failure and other comorbidities.;Nutrition handout(s) given to patient.    Expected Outcomes Short Term Goal: Understand basic principles of dietary content, such as calories, fat, sodium, cholesterol and nutrients.;Short Term Goal: A plan has been developed with personal nutrition goals set during dietitian appointment.;Long Term Goal: Adherence to prescribed nutrition plan.             Nutrition Assessments:  MEDIFICTS Score Key: ?70 Need to make dietary changes  40-70 Heart Healthy Diet ? 40 Therapeutic Level Cholesterol Diet  Flowsheet Row Cardiac Rehab from 08/15/2021 in [facility] Cardiac and Pulmonary Rehab  Picture Your Plate Total Score on Admission 83      Picture Your Plate Scores: <78 Unhealthy dietary pattern with much room for improvement. 41-50 Dietary pattern unlikely to meet recommendations for good health and room for improvement. 51-60 More healthful dietary pattern, with some room for improvement.  >60 Healthy dietary pattern, although  there may be some specific behaviors that could be improved.    Nutrition Goals Re-Evaluation:   Nutrition Goals Discharge (Final Nutrition Goals Re-Evaluation):   Psychosocial: Target Goals: Acknowledge presence or absence of significant depression and/or stress, maximize coping skills, provide positive support system. Participant is able to verbalize types and ability to use techniques and skills needed for reducing stress and depression.   Education: Stress, Anxiety, and Depression - Group verbal and visual presentation to define topics covered.  Reviews how body is impacted by stress, anxiety, and depression.  Also discusses healthy ways to reduce stress and to treat/manage anxiety and depression.  Written material given at graduation.   Education: Sleep Hygiene -Provides group verbal and written instruction about how sleep can affect your health.  Define sleep hygiene, discuss sleep cycles and impact of sleep habits. Review good sleep hygiene tips.    Initial Review & Psychosocial Screening:  Initial Psych Review & Screening - 08/02/21 1039       Initial Review   Current issues with None Identified      Family Dynamics   Good Support System? Yes   wife, grandchildren,     Barriers   Psychosocial barriers to participate in program There are no identifiable barriers or psychosocial needs.      Screening Interventions   Interventions Encouraged to exercise    Expected Outcomes Short Term goal: Utilizing psychosocial counselor, staff and physician to assist with identification of specific Stressors or current issues interfering with healing process. Setting desired goal for each stressor or current issue identified.;Long Term Goal: Stressors or current issues are  controlled or eliminated.;Short Term goal: Identification and review with participant of any Quality of Life or Depression concerns found by scoring the questionnaire.;Long Term goal: The participant improves quality of  Life and PHQ9 Scores as seen by post scores and/or verbalization of changes             Quality of Life Scores:   Quality of Life - 10/24/21 0831       Quality of Life Scores   Health/Function Post 29.33 %    Socioeconomic Post 25.5 %    Psych/Spiritual Post 30 %    Family Post 30 %    GLOBAL Post 28.69 %            Scores of 19 and below usually indicate a poorer quality of life in these areas.  A difference of  2-3 points is a clinically meaningful difference.  A difference of 2-3 points in the total score of the Quality of Life Index has been associated with significant improvement in overall quality of life, self-image, physical symptoms, and general health in studies assessing change in quality of life.  PHQ-9: Recent Review Flowsheet Data     Depression screen [facility] 2/9 08/15/2021   Decreased Interest 0   Down, Depressed, Hopeless 0   PHQ - 2 Score 0   Altered sleeping 0   Tired, decreased energy 1   Change in appetite 0   Feeling bad or failure about yourself  0   Trouble concentrating 0   Moving slowly or fidgety/restless 0   Suicidal thoughts 0   PHQ-9 Score 1      Interpretation of Total Score  Total Score Depression Severity:  1-4 = Minimal depression, 5-9 = Mild depression, 10-14 = Moderate depression, 15-19 = Moderately severe depression, 20-27 = Severe depression   Psychosocial Evaluation and Intervention:  Psychosocial Evaluation - 08/02/21 1048       Psychosocial Evaluation & Interventions   Interventions Encouraged to exercise with the program and follow exercise prescription    Comments Adam Zavala has no barriers to attending the program. He is retired and continues to [employer] with his company. He travels frequesntly  and stays active. daily walks, to hunting and other activities. His wife and family and the employees of his company are his support team.  He expresses no stress and he is thankful for every day he sees the sun.  He will work on the  program as a hybrid participnat,and will review the session schedule with the EP during his Exercise evaluation and gym orientation.    Expected Outcomes STG: Tomoki will continue to grow with his knowledge of cardiovascular disease and how to manage his risk factors.  LTG: Adam Zavala will continue to progress his exercise and work on keeping his risk factors under control.    Continue Psychosocial Services  Follow up required by staff             Psychosocial Re-Evaluation:  Psychosocial Re-Evaluation     [place] Name 09/07/21 0734             Psychosocial Re-Evaluation   Current issues with None Identified       Comments Adam Zavala reports no stress at this time. is grateful to be alive and stays positive. He has a great support system in his family. He reports sleeping "like a log" and he goes to be at 9 and wakes up at 5.       Expected Outcomes ST: continue to use family for  support LT: maintain positive attitude       Interventions Encouraged to attend Cardiac Rehabilitation for the exercise       Continue Psychosocial Services  Follow up required by staff                Psychosocial Discharge (Final Psychosocial Re-Evaluation):  Psychosocial Re-Evaluation - 09/07/21 0734       Psychosocial Re-Evaluation   Current issues with None Identified    Comments Adam Zavala reports no stress at this time. is grateful to be alive and stays positive. He has a great support system in his family. He reports sleeping "like a log" and he goes to be at 9 and wakes up at 5.    Expected Outcomes ST: continue to use family for support LT: maintain positive attitude    Interventions Encouraged to attend Cardiac Rehabilitation for the exercise    Continue Psychosocial Services  Follow up required by staff             Vocational Rehabilitation: Provide vocational rehab assistance to qualifying candidates.   Vocational Rehab Evaluation & Intervention:   Education: Education Goals: Education classes  will be provided on a variety of topics geared toward better understanding of heart health and risk factor modification. Participant will state understanding/return demonstration of topics presented as noted by education test scores.  Learning Barriers/Preferences:   General Cardiac Education Topics:  AED/CPR: - Group verbal and written instruction with the use of models to demonstrate the basic use of the AED with the basic ABC's of resuscitation.   Anatomy and Cardiac Procedures: - Group verbal and visual presentation and models provide information about basic cardiac anatomy and function. Reviews the testing methods done to diagnose heart disease and the outcomes of the test results. Describes the treatment choices: Medical Management, Angioplasty, or Coronary Bypass Surgery for treating various heart conditions including Myocardial Infarction, Angina, Valve Disease, and Cardiac Arrhythmias.  Written material given at graduation.   Medication Safety: - Group verbal and visual instruction to review commonly prescribed medications for heart and lung disease. Reviews the medication, class of the drug, and side effects. Includes the steps to properly store meds and maintain the prescription regimen.  Written material given at graduation.   Intimacy: - Group verbal instruction through game format to discuss how heart and lung disease can affect sexual intimacy. Written material given at graduation..   Know Your Numbers and Heart Failure: - Group verbal and visual instruction to discuss disease risk factors for cardiac and pulmonary disease and treatment options.  Reviews associated critical values for Overweight/Obesity, Hypertension, Cholesterol, and Diabetes.  Discusses basics of heart failure: signs/symptoms and treatments.  Introduces Heart Failure Zone chart for action plan for heart failure.  Written material given at graduation.   Infection Prevention: - Provides verbal and written  material to individual with discussion of infection control including proper hand washing and proper equipment cleaning during exercise session. Flowsheet Row Cardiac Rehab from 08/15/2021 in [facility] Cardiac and Pulmonary Rehab  Education need identified 08/15/21  Date 08/15/21  Educator [place]  Instruction Review Code 1- Verbalizes Understanding       Falls Prevention: - Provides verbal and written material to individual with discussion of falls prevention and safety. Flowsheet Row Cardiac Rehab from 08/15/2021 in [facility] Cardiac and Pul[facility] Rehab  Education need identified 08/15/21  Date 08/15/21  Educator [place]  Instruction Review Code 1- Verbalizes Understanding       Other: -Provides group and verbal instruction on  various topics (see comments)   Knowledge Questionnaire Score:  Knowledge Questionnaire Score - 10/24/21 0832       Knowledge Questionnaire Score   Post Score 23/26: Nutrition, MI, Exercise             Core Components/Risk Factors/Patient Goals at Admission:  Personal Goals and Risk Factors at Admission - 08/15/21 1418       Core Components/Risk Factors/Patient Goals on Admission    Weight Management Yes;Weight Loss    Intervention Weight Management: Develop a combined nutrition and exercise program designed to reach desired caloric intake, while maintaining appropriate intake of nutrient and fiber, sodium and fats, and appropriate energy expenditure required for the weight goal.;Weight Management: Provide education and appropriate resources to help participant work on and attain dietary goals.;Weight Management/Obesity: Establish reasonable short term and long term weight goals.    Admit Weight 243 lb (110.2 kg)    Goal Weight: Short Term 239 lb (108.4 kg)    Goal Weight: Long Term 233 lb (105.7 kg)    Expected Outcomes Short Term: Continue to assess and modify interventions until short term weight is achieved;Weight Maintenance: Understanding of the daily  nutrition guidelines, which includes 25-35% calories from fat, 7% or less cal from saturated fats, less than 200mg  cholesterol, less than 1.5gm of sodium, & 5 or more servings of fruits and vegetables daily;Weight Loss: Understanding of general recommendations for a balanced deficit meal plan, which promotes 1-2 lb weight loss per week and includes a negative energy balance of [phone] kcal/d;Understanding recommendations for meals to include 15-35% energy as protein, 25-35% energy from fat, 35-60% energy from carbohydrates, less than 200mg  of dietary cholesterol, 20-35 gm of total fiber daily;Understanding of distribution of calorie intake throughout the day with the consumption of 4-5 meals/snacks    Hypertension Yes    Intervention Provide education on lifestyle modifcations including regular physical activity/exercise, weight management, moderate sodium restriction and increased consumption of fresh fruit, vegetables, and low fat dairy, alcohol moderation, and smoking cessation.;Monitor prescription use compliance.    Expected Outcomes Short Term: Continued assessment and intervention until BP is < 140/68mm HG in hypertensive participants. < 130/41mm HG in hypertensive participants with diabetes, heart failure or chronic kidney disease.;Long Term: Maintenance of blood pressure at goal levels.    Lipids Yes    Intervention Provide education and support for participant on nutrition & aerobic/resistive exercise along with prescribed medications to achieve LDL 70mg , HDL >40mg .    Expected Outcomes Short Term: Participant states understanding of desired cholesterol values and is compliant with medications prescribed. Participant is following exercise prescription and nutrition guidelines.;Long Term: Cholesterol controlled with medications as prescribed, with individualized exercise RX and with personalized nutrition plan. Value goals: LDL < 70mg , HDL > 40 mg.             Education:Diabetes -  Individual verbal and written instruction to review signs/symptoms of diabetes, desired ranges of glucose level fasting, after meals and with exercise. Acknowledge that pre and post exercise glucose checks will be done for 3 sessions at entry of program.   Core Components/Risk Factors/Patient Goals Review:   Goals and Risk Factor Review     Row Name 09/07/21 0730             Core Components/Risk Factors/Patient Goals Review   Personal Goals Review Lipids;Hypertension       Review Today His BP was 124/64. BP 128-135/75-82. He did a stress test yesterday - it went well. He is currently taking  all of his medications as prescribed and is not having any issues at this time.       Expected Outcomes ST: continue checking blood pressures at home LT: continue to manage risk factors                Core Components/Risk Factors/Patient Goals at Discharge (Final Review):   Goals and Risk Factor Review - 09/07/21 0730       Core Components/Risk Factors/Patient Goals Review   Personal Goals Review Lipids;Hypertension    Review Today His BP was 124/64. BP 128-135/75-82. He did a stress test yesterday - it went well. He is currently taking all of his medications as prescribed and is not having any issues at this time.    Expected Outcomes ST: continue checking blood pressures at home LT: continue to manage risk factors             ITP Comments:  ITP Comments     Row Name 08/02/21 1056 08/15/21 1210 08/21/21 1528 09/06/21 0850 09/07/21 0724   ITP Comments Virtual orientation call completed today. he has an appointment on Date: 08/15/2021  for EP eval and gym Orientation.  [facility]  Date: 06/12/2021 . Completed 6MWT and gym orientation. Initial ITP created and sent for review toDr. Emily Filbert, Medical Director. Patient will be participating in the hybrid program. Will attend to rehab in person 1x/month. First follow up email for patient completed and sent  today for patient's cardiac rehab hybrid program. EP sent patient home exercise plan instructions including THRZ, exercise restrictions, symptoms to monitor, and how to send an exercise log going forward. EP sent [facility] Cardiac and Pulmonary Youtube account as well as RPE & Dyspnea scales. Encouraged patient to ask questions. Will follow up weekly with home exercise. 30 day review completed. ITP sent to Dr. Emily Filbert, Medical Director of Cardiac Rehab. Continue with ITP unless changes are made by physician.  Pt is doing our hybrid program and supposed to come for monthly check in in person tomorrow. First full day of exercise!  Patient was oriented to gym and equipment including functions, settings, policies, and procedures.  Patient's individual exercise prescription and treatment plan were reviewed.  All starting workloads were established based on the results of the 6 minute walk test done at initial orientation visit.  The plan for exercise progression was also introduced and progression will be customized based on patient's performance and goals.    [place] Name 09/27/21 1232 10/04/21 0657 11/01/21 0643 11/06/21 0936 11/09/21 0724   ITP Comments See exercise section for changes and goals.Sidhant has been keeping a log of his walking at home.  Staff have reviewed and will check in next time he has in person visit. 30 Day review completed. Medical Director ITP review done, changes made as directed, and signed approval by Medical Director. 30 Day review completed. Medical Director ITP review done, changes made as directed, and signed approval by Medical Director. Patient was due for his last in person exercise session on 10/25/21, however, patient had an emergency bone graft completed and could not exercise. He is due to come in on 11/09/21 which is when he will do his post 6MWT and graduate. Maurilio graduated today from  rehab with 3 (hybrid) sessions completed.  Details of the patient's exercise prescription and what  He needs to do in order to continue the prescription and progress were discussed with patient.  Patient was given a copy of  prescription and goals.  Patient verbalized understanding.  Maxim plans to continue to exercise by exercising at home.            Comments: discharge ITP

## 2021-11-09 NOTE — Patient Instructions (Signed)
Discharge Patient Instructions  Patient Details  Name: Adam Zavala. MRN: [id number] Date of Birth: Apr 27, 1946 Referring Provider:  Corey Skains, MD   Number of Visits: 4 (hybrid program)  Reason for Discharge:  Patient reached a stable level of exercise. Patient independent in their exercise. Patient has met program and personal goals.  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  Initial Exercise Prescription:  Initial Exercise Prescription - 08/15/21 1400       Date of Initial Exercise RX and Referring Provider   Date 08/15/21    Referring Provider Adam Royals MD      Treadmill   MPH 2.9    Grade 1    Minutes 15    METs 3.62      NuStep   Level 3    SPM 80    Minutes 15    METs 2.6      REL-XR   Level 2    Speed 50    Minutes 15    METs 2.6      Prescription Details   Frequency (times per week) Hybrid   1x/month in rehab; Home exercise provided   Duration Progress to 30 minutes of continuous aerobic without signs/symptoms of physical distress      Intensity   THRR 40-80% of Max Heartrate 92-127    Ratings of Perceived Exertion 11-13    Perceived Dyspnea 0-4      Progression   Progression Continue to progress workloads to maintain intensity without signs/symptoms of physical distress.      Resistance Training   Training Prescription Yes    Weight 5 lb    Reps 10-15             Discharge Exercise Prescription (Final Exercise Prescription Changes):  Exercise Prescription Changes - 10/25/21 1400       Response to Exercise   Blood Pressure (Admit) 137/87    Blood Pressure (Exit) 141/83    Heart Rate (Admit) 58 bpm    Heart Rate (Exit) 114 bpm    Oxygen Saturation (Admit) 97 %    Oxygen Saturation (Exercise) 98 %    Comments self reported walking    Duration Continue with 30 min of aerobic exercise without  signs/symptoms of physical distress.    Intensity THRR unchanged      Progression   Progression Continue to progress workloads to maintain intensity without signs/symptoms of physical distress.    Average METs 3.3      Resistance Training   Training Prescription Yes      Home Exercise Plan   Plans to continue exercise at Home (comment)   Hybrid: walking   Frequency Add 3 additional days to program exercise sessions.   Hybrid; 1 session in person/ month   Initial Home Exercises Provided 08/21/21             Functional Capacity:  6 Minute Walk     Row Name 08/15/21 1329 11/09/21 0750       6 Minute Walk   Phase Initial Discharge    Distance 1460 feet 1700 feet    Distance % Change -- 16 %    Distance Feet Change -- 240 ft    Walk Time 6 minutes 6 minutes    # of Rest Breaks 0 0    MPH 2.76 3.22  METS 2.67 2.85    RPE 9 11    Perceived Dyspnea  0 0    VO2 Peak 9.35 9.98    Symptoms No No    Resting HR 58 bpm 70 bpm    Resting BP 128/72 110/62    Resting Oxygen Saturation  99 % --    Exercise Oxygen Saturation  during 6 min walk 97 % --    Max Ex. HR 92 bpm 86 bpm    Max Ex. BP 140/72 138/82    2 Minute Post BP 130/72 --              Nutrition & Weight - Outcomes:  Pre Biometrics - 08/15/21 1356       Pre Biometrics   Height 6' 1.2" (1.859 m)    Weight 243 lb 12.8 oz (110.6 kg)    BMI (Calculated) 32    Single Leg Stand 30 seconds             Post Biometrics - 11/09/21 0753        Post  Biometrics   Height 6' 1.2" (1.859 m)    Weight 244 lb 11.2 oz (111 kg)    BMI (Calculated) 32.12    Single Leg Stand 30 seconds             Nutrition:  Nutrition Therapy & Goals - 09/07/21 0736       Nutrition Therapy   Diet Heart healthy, low Na    Drug/Food Interactions Statins/Certain Fruits    Protein (specify units) 85g    Fiber 30 grams    Whole Grain Foods 3 servings    Saturated Fats 12 max. grams    Fruits and Vegetables 8  servings/day    Sodium 1.5 grams      Personal Nutrition Goals   Nutrition Goal ST:LT: continue with current diet, no goals at this time (goal making sheet provided)    Comments Adam Zavala reports doing well with his diet. His PYP was 83, a good score is >60. Unable to get a full food recall; he limits processed foods, only uses salt on tomato sandwiches, does not eat any high fat dairy products, eats mostly chicken and fish for his protein sources. He reports enjoying his food. Adam Zavala reports having a good understanding of heart healthy eating, reviewed specifics and numbers. Provided handouts.      Intervention Plan   Intervention Prescribe, educate and counsel regarding individualized specific dietary modifications aiming towards targeted core components such as weight, hypertension, lipid management, diabetes, heart failure and other comorbidities.;Nutrition handout(s) given to patient.    Expected Outcomes Short Term Goal: Understand basic principles of dietary content, such as calories, fat, sodium, cholesterol and nutrients.;Short Term Goal: A plan has been developed with personal nutrition goals set during dietitian appointment.;Long Term Goal: Adherence to prescribed nutrition plan.              Goals reviewed with patient; copy given to patient.

## 2021-11-09 NOTE — Progress Notes (Signed)
Daily Session Note  Patient Details  Name: Adam Zavala. MRN: [id number] Date of Birth: 1946-08-25 Referring Provider:   Flowsheet Row Cardiac Rehab from 08/15/2021 in [facility] Cardiac and Pulmonary Rehab  Referring Provider Adam Royals MD       Encounter Date: 11/09/2021  Check In:  Session Check In - 11/09/21 0723       Check-In   Supervising physician immediately available to respond to emergencies See telemetry face sheet for immediately available ER MD    Location [facility]-Cardiac & Pulmonary Rehab    Staff Present Birdie Sons, MPA, RN;Amanda Oletta Darter, BA, ACSM CEP, Exercise Physiologist;Dewitte Vannice Amedeo Plenty, BS, ACSM CEP, Exercise Physiologist    Virtual Visit No    Medication changes reported     Yes    Comments off eliquis, and BP med    Fall or balance concerns reported    No    Warm-up and Cool-down Performed on first and last piece of equipment    Resistance Training Performed Yes    VAD Patient? No    PAD/SET Patient? No      Pain Assessment   Currently in Pain? No/denies                Social History   Tobacco Use  Smoking Status Former   Types: Cigars  Smokeless Tobacco Never    Goals Met:  Independence with exercise equipment Exercise tolerated well No report of concerns or symptoms today Strength training completed today  Goals Unmet:  Not Applicable  Comments:  Adam Zavala graduated today from  rehab with 3 (hybrid) sessions completed.  Details of the patient's exercise prescription and what He needs to do in order to continue the prescription and progress were discussed with patient.  Patient was given a copy of prescription and goals.  Patient verbalized understanding.  Adam Zavala plans to continue to exercise by exercising at home.    Dr. Emily Filbert is Medical Director for [place].  Dr. Ottie Glazier is Medical Director for [facility] Pulmonary Rehabilitation.

## 2021-11-09 NOTE — Progress Notes (Signed)
Discharge Progress Report  Patient Details  Name: Adam Zavala. MRN: [id number] Date of Birth: Jan 03, 1946 Referring Provider:   Flowsheet Row Cardiac Rehab from 08/15/2021 in [facility]  Referring Provider Adam Royals MD        Number of Visits: 3 (hybrid)  Reason for Discharge:  Patient reached a stable level of exercise. Patient independent in their exercise. Patient has met program and personal goals.  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  ADL UCSD:   Initial Exercise Prescription:  Initial Exercise Prescription - 08/15/21 1400       Date of Initial Exercise RX and Referring Provider   Date 08/15/21    Referring Provider Adam Royals MD      Treadmill   MPH 2.9    Grade 1    Minutes 15    METs 3.62      NuStep   Level 3    SPM 80    Minutes 15    METs 2.6      REL-XR   Level 2    Speed 50    Minutes 15    METs 2.6      Prescription Details   Frequency (times per week) Hybrid   1x/month in rehab; Home exercise provided   Duration Progress to 30 minutes of continuous aerobic without signs/symptoms of physical distress      Intensity   THRR 40-80% of Max Heartrate 92-127    Ratings of Perceived Exertion 11-13    Perceived Dyspnea 0-4      Progression   Progression Continue to progress workloads to maintain intensity without signs/symptoms of physical distress.      Resistance Training   Training Prescription Yes    Weight 5 lb    Reps 10-15             Discharge Exercise Prescription (Final Exercise Prescription Changes):  Exercise Prescription Changes - 10/25/21 1400       Response to Exercise   Blood Pressure (Admit) 137/87    Blood Pressure (Exit) 141/83    Heart Rate (Admit) 58 bpm    Heart Rate (Exit) 114 bpm    Oxygen Saturation (Admit) 97 %    Oxygen Saturation (Exercise) 98 %     Comments self reported walking    Duration Continue with 30 min of aerobic exercise without signs/symptoms of physical distress.    Intensity THRR unchanged      Progression   Progression Continue to progress workloads to maintain intensity without signs/symptoms of physical distress.    Average METs 3.3      Resistance Training   Training Prescription Yes      Home Exercise Plan   Plans to continue exercise at Home (comment)   Hybrid: walking   Frequency Add 3 additional days to program exercise sessions.   Hybrid; 1 session in person/ month   Initial Home Exercises Provided 08/21/21             Functional Capacity:  6 Minute Walk     Row Name 08/15/21 1329 11/09/21 0750       6 Minute Walk   Phase Initial Discharge    Distance 1460 feet 1700 feet    Distance % Change -- 16 %    Distance Feet Change -- 240 ft  Walk Time 6 minutes 6 minutes    # of Rest Breaks 0 0    MPH 2.76 3.22    METS 2.67 2.85    RPE 9 11    Perceived Dyspnea  0 0    VO2 Peak 9.35 9.98    Symptoms No No    Resting HR 58 bpm 70 bpm    Resting BP 128/72 110/62    Resting Oxygen Saturation  99 % --    Exercise Oxygen Saturation  during 6 min walk 97 % --    Max Ex. HR 92 bpm 86 bpm    Max Ex. BP 140/72 138/82    2 Minute Post BP 130/72 --             Psychological, QOL, Others - Outcomes: PHQ 2/9: Depression screen PHQ 2/9 08/15/2021  Decreased Interest 0  Down, Depressed, Hopeless 0  PHQ - 2 Score 0  Altered sleeping 0  Tired, decreased energy 1  Change in appetite 0  Feeling bad or failure about yourself  0  Trouble concentrating 0  Moving slowly or fidgety/restless 0  Suicidal thoughts 0  PHQ-9 Score 1    Quality of Life:  Quality of Life - 10/24/21 0831       Quality of Life Scores   Health/Function Post 29.33 %    Socioeconomic Post 25.5 %    Psych/Spiritual Post 30 %    Family Post 30 %    GLOBAL Post 28.69 %              Nutrition & Weight -  Outcomes:  Pre Biometrics - 08/15/21 1356       Pre Biometrics   Height 6' 1.2" (1.859 m)    Weight 243 lb 12.8 oz (110.6 kg)    BMI (Calculated) 32    Single Leg Stand 30 seconds             Post Biometrics - 11/09/21 0753        Post  Biometrics   Height 6' 1.2" (1.859 m)    Weight 244 lb 11.2 oz (111 kg)    BMI (Calculated) 32.12    Single Leg Stand 30 seconds             Nutrition:  Nutrition Therapy & Goals - 09/07/21 0736       Nutrition Therapy   Diet Heart healthy, low Na    Drug/Food Interactions Statins/Certain Fruits    Protein (specify units) 85g    Fiber 30 grams    Whole Grain Foods 3 servings    Saturated Fats 12 max. grams    Fruits and Vegetables 8 servings/day    Sodium 1.5 grams      Personal Nutrition Goals   Nutrition Goal ST:LT: continue with current diet, no goals at this time (goal making sheet provided)    Comments Adam Zavala reports doing well with his diet. His PYP was 83, a good score is >60. Unable to get a full food recall; he limits processed foods, only uses salt on tomato sandwiches, does not eat any high fat dairy products, eats mostly chicken and fish for his protein sources. He reports enjoying his food. Adam Zavala reports having a good understanding of heart healthy eating, reviewed specifics and numbers. Provided handouts.      Intervention Plan   Intervention Prescribe, educate and counsel regarding individualized specific dietary modifications aiming towards targeted core components such as weight, hypertension, lipid management, diabetes, heart failure  and other comorbidities.;Nutrition handout(s) given to patient.    Expected Outcomes Short Term Goal: Understand basic principles of dietary content, such as calories, fat, sodium, cholesterol and nutrients.;Short Term Goal: A plan has been developed with personal nutrition goals set during dietitian appointment.;Long Term Goal: Adherence to prescribed nutrition plan.              Nutrition Discharge:   Education Questionnaire Score:  Knowledge Questionnaire Score - 10/24/21 0832       Knowledge Questionnaire Score   Post Score 23/26: Nutrition, MI, Exercise             Goals reviewed with patient; copy given to patient.

## 2021-11-14 IMAGING — DX DG CHEST 1V PORT
1 series · 1 of 1 positions shown · non-contrast
Comparison: June 14, 2021

CLINICAL DATA: Patient feels like he is in AFib.

EXAM:
PORTABLE CHEST 1 VIEW

[chest ap]
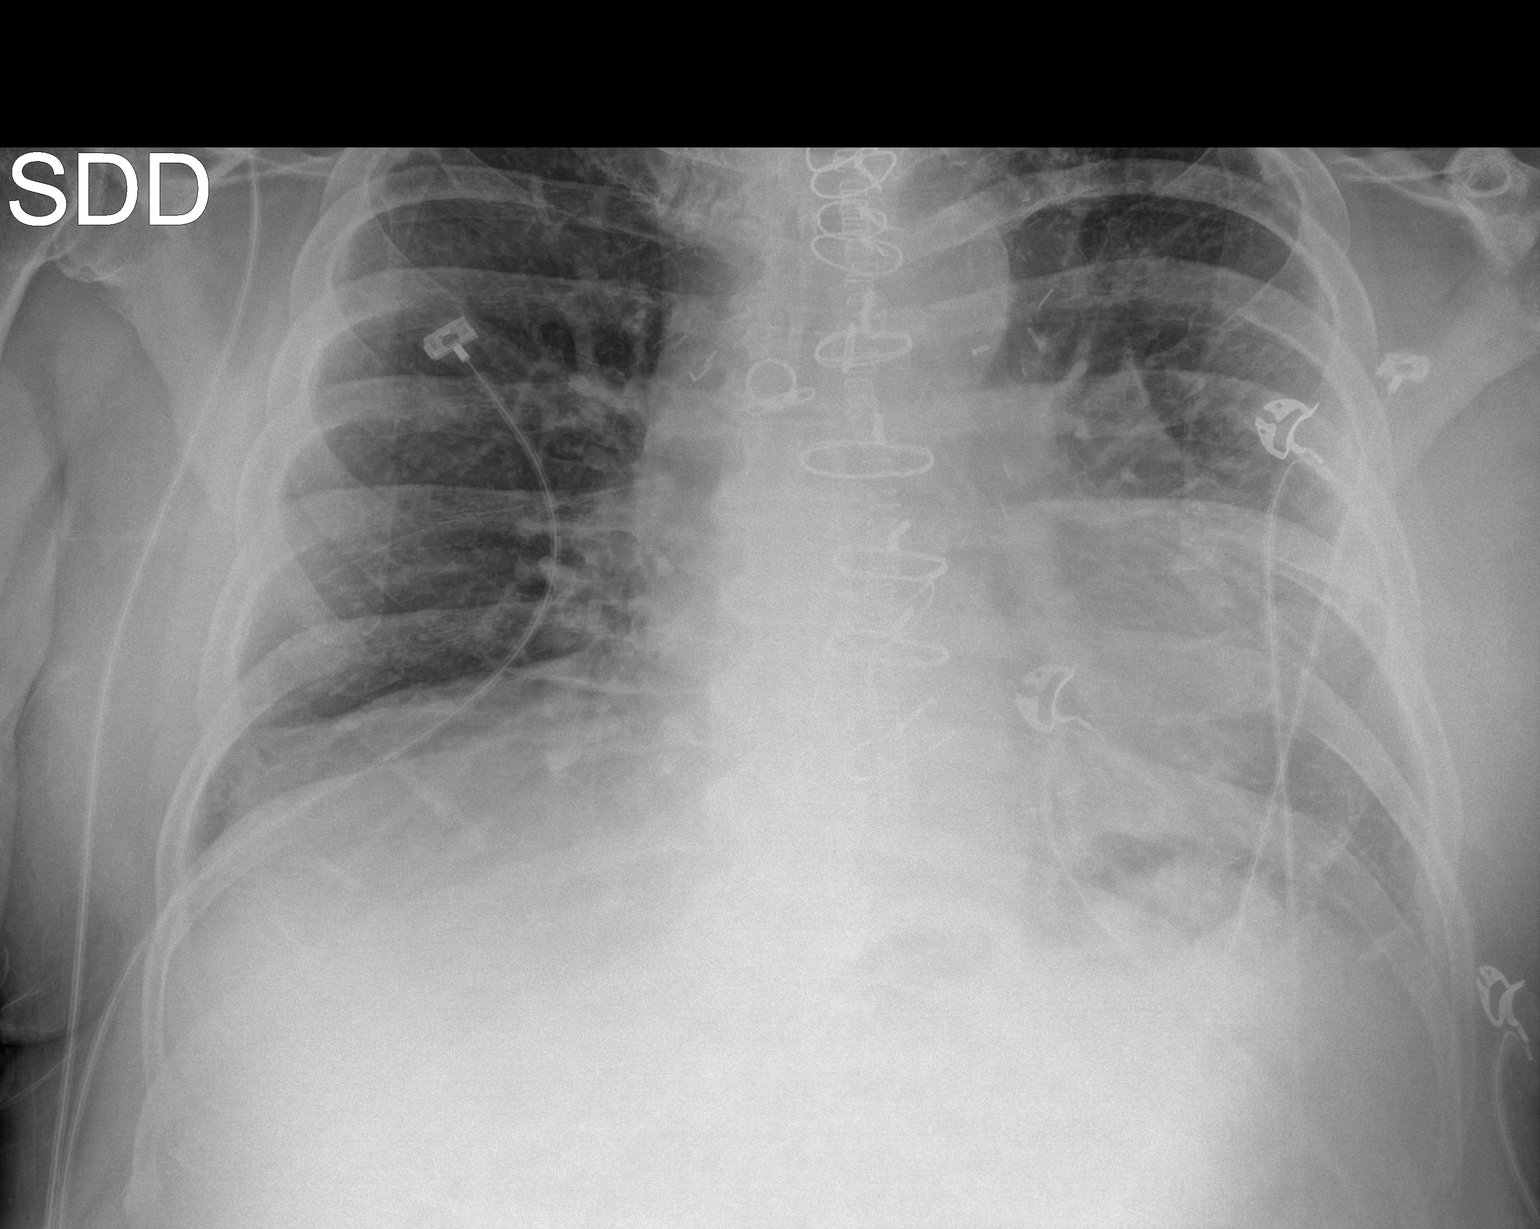

[1 of 1 positions shown; findings below may reference images not displayed]

FINDINGS: Multiple sternal wires and vascular clips are seen. Decreased lung
volumes are noted which is likely secondary to the degree of patient
inspiration. Mild atelectasis is suspected within the retrocardiac
region of the left lung base. There is no evidence of a pleural
effusion or pneumothorax. The cardiac silhouette is mildly enlarged.
Degenerative changes seen throughout the thoracic spine.
IMPRESSION: Mild cardiomegaly with prior median sternotomy.

## 2022-04-04 NOTE — Progress Notes (Signed)
Virtual Visit via Video Note ? ?I connected with Adam Zavala. on 04/05/2022 at 11:15 AM EDT by a video enabled telemedicine application and verified that I am speaking with the correct person using two identifiers. ? ?Location: ?Patient: home ?Provider: office ?  ?I discussed the limitations of evaluation and management by telemedicine and the availability of in person appointments. The patient expressed understanding and agreed to proceed. ? ?History of Present Illness: ?Adam Zavala. is a 76 y.o.male with a personal history of right vasitis nodosum status post excision  and testicular pain who presents today for discussion of medications.  ? ?He is managed on tamsulosin for his urinary symptoms which he started at some point after his acute PSA rise and negative prostate biopsy.  He leaves this is worked fairly well both for control of his urinary symptoms as well as a downward trending PSA.  He has minimal to no urinary symptoms, no complaints. ? ?He questions today about his new onset erectile dysfunction.  He is having increasing difficulties maintaining and achieving erection.  In talking to his pharmacist, he was made aware that daily Cialis could help both his urinary symptoms as well as his ED. ? ?He has multiple risk factors for erectile dysfunction including known cardiac disease. ? ?Patient denies any modifying or aggravating factors.  Patient denies any gross hematuria, dysuria or suprapubic/flank pain.  Patient denies any fevers, chills, nausea or vomiting.  ? ?Never previously tried any PDE 5 inhibitors.  He does have a prescription for nitroglycerin but has never had to use this medication. ? ?Most recent PSA 0.77 on 04/2021 ?  ?Observations/Objective: ?Pleasant, interactive ? ? ? ?Assessment and Plan: ?Erectile dysfunction  ?- We discussed the pathophysiology of erectile dysfunction today along with possible contributing factors. Discussed possible treatment options including PDE 5  inhibitors. ? ?In terms of PDE 5 inhibitors, we discussed contraindications for this medication as well as common side effects. Patient was counseled on optimal use. All of his questions were answered in detail. ? ?He is aware that he should not take this with nitroglycerin as well alert EMS that he is been taking Cialis if he does have a cardiac episode ? ?- He is interested in cialis 5 mg daily today. Discussed the risk and benefits of the medication.  ? ?2. BPH ?-PSA appropriate ?-Has been on Flomax for many years for personal history of BPH, symptoms well controlled on this medication. ?-He has elected to take Cialis daily which may also control his urinary symptoms.  We discussed that it would be reasonable to can consider titrating off of the Flomax once he is on the Cialis if it does help with his erectile dysfunction in order to reduce polypharmacy, he may try this ? ? ?Follow Up Instructions: ? ?I discussed the assessment and treatment plan with the patient. The patient was provided an opportunity to ask questions and all were answered. The patient agreed with the plan and demonstrated an understanding of the instructions. ?  ?The patient was advised to call back or seek an in-person evaluation if the symptoms worsen or if the condition fails to improve as anticipated. ? ?I provided 15 minutes of non-face-to-face time during this encounter. ? ? ?I,Kailey Littlejohn,acting as a scribe for [name], MD.,have documented all relevant documentation on the behalf of [name], MD,as directed by  [name], MD while in the presence of [name], MD. ?

## 2022-04-05 ENCOUNTER — Ambulatory Visit (INDEPENDENT_AMBULATORY_CARE_PROVIDER_SITE_OTHER): Payer: Medicare Other | Admitting: Urology

## 2022-04-05 DIAGNOSIS — N400 Enlarged prostate without lower urinary tract symptoms: Secondary | ICD-10-CM

## 2022-04-05 DIAGNOSIS — N5203 Combined arterial insufficiency and corporo-venous occlusive erectile dysfunction: Secondary | ICD-10-CM

## 2022-04-05 MED ORDER — TADALAFIL 5 MG PO TABS: 5.0000 mg | ORAL_TABLET | Freq: Every day | ORAL | 11 refills | Status: DC | PRN

## 2022-04-05 NOTE — Progress Notes (Signed)
This service is provided via telemedicine ? ? ?No vital signs collected/recorded due to the encounter was a telemedicine visit.  ? ? ? ?Patient consents to a telephone visit:  yes ? ? ? ?Names of all persons participating in the telemedicine service and their role in the encounter:  Adam Zavala, Dr. Erlene Quan ?  ?

## 2022-07-31 ENCOUNTER — Encounter: Payer: Self-pay | Admitting: Cardiovascular Disease

## 2022-12-23 NOTE — Progress Notes (Signed)
Cardiology Office Note  Date:  12/24/2022   ID:  Adam Hearn., DOB 06/19/1946, MRN [id number]  PCP:  Adin Hector, MD   Chief Complaint  Patient presents with   New Patient (Initial Visit)    Establish care CAD/CABG x 3/stent placement; aortic stenosis, paroxysmal A-Fib. "Doing well." Medications reviewed by the patient verbally.     HPI:  Adam Zavala is a 77 year old gentleman with past medical history of CAD, s/p DES stent 03/2013,  aortic valve stenosis s/p Hancock porcine valve replacement in April 2014  Paroxysmal atrial fibrillation, postop CABG s/ps/p CABG x3 (LIMA-LAD, SVG-OM1, SVG-PDA) on 06/13/21 history of aortic valve replacement for valve stenosis  with bioprosthetic valve in 2015, [facility] hypertension,  OSA on CPAP,  hyperlipidemia.  Former smoker quit age 34 Who presents to establish care in the [place] office for his PAF, coronary disease, bioprosthetic aortic valve  Previously [facility]ology at [facility] last seen May 2023 Prior cardiac surgeries discussed with him including DES stent placed April 2014, aortic valve replacement with porcine prosthesis April 2014  bypass grafting x 3 in 06/2021  Postoperative atrial fibrillation  Active at baseline, likes to go hunting and fishing, spends half the year in [place] and warmer weather Retired but stays busy on [employer] of several [place].  With statin for lipid management  Blood pressure typically well-controlled A1c less than 6  Other records reviewed cardiac catheterization from July 2022 showed progressive multivessel coronary artery obstructive disease deemed not amenable to further PCI (severe left main bifurcation disease with 60-70% RCA).     EKG personally reviewed by myself on todays visit Normal sinus rhythm rate 74 bpm consider old anterior MI, left axis deviation  PMH:   has a past medical history of A-fib [facility] Aortic stenosis, Aortic valve replaced (03/2013),  Coronary artery disease, Dysrhythmia, Elevated PSA, GERD (gastroesophageal reflux disease), Heart attack ([place]), Heart murmur, Hyperlipidemia, Hypertension, Pneumonia, and Sleep apnea.  PSH:    Past Surgical History:  Procedure Laterality Date   achilles tendon rupture repair     AORTIC VALVE SURGERY     CARDIAC VALVE REPLACEMENT  03/13/2013   Aortic 29MM  Hancock II Porcine Heart Valve   CHOLECYSTECTOMY     COLON SURGERY     COLONOSCOPY W/ BIOPSIES AND POLYPECTOMY     3 polyps removed   COLONOSCOPY WITH PROPOFOL N/A 04/16/2018   Procedure: COLONOSCOPY WITH PROPOFOL;  Surgeon: [name], MD;  Location[facility] ENDOSCOPY;  Service: Endoscopy;  Laterality: N/A;   CORONARY ANGIOPLASTY WITH STENT PLACEMENT  02/05/2014   2.75 mm x 14 mm   EPIDIDYMECTOMY Right 11/12/2016   Procedure: PARTIAL EPIDIDYMECTOMY;  Surgeon: [name], MD;  Location: [facility] ORS;  Service: Urology;  Laterality: Right;   partial removal of colon     PROSTATE BIOPSY     right shoulder surgery     separation/ dislocated   ROTATOR CUFF REPAIR     S/p rectotomy for fissures     SPHINCTEROTOMY     VASECTOMY Right 11/12/2016   Procedure: VASECTOMY;  Surgeon: [name], MD;  Location: [facility] ORS;  Service: Urology;  Laterality: Right;    Current Outpatient Medications  Medication Sig Dispense Refill   amLODipine (NORVASC) 5 MG tablet Take 5 mg by mouth daily.     aspirin 81 MG tablet Take 81 mg by mouth daily.     atorvastatin (LIPITOR) 80 MG tablet Take by mouth.  esomeprazole (NEXIUM) 40 MG capsule Take 40 mg by mouth daily before breakfast.     meloxicam (MOBIC) 15 MG tablet Take 15 mg by mouth daily.     metoprolol tartrate (LOPRESSOR) 25 MG tablet Take 1 tablet by mouth 2 (two) times daily.     nitroGLYCERIN (NITROSTAT) 0.4 MG SL tablet Place 0.5 mg under the tongue as directed.     PRALUENT 150 MG/ML SOAJ Inject into the skin.     Tamsulosin HCl (FLOMAX) 0.4 MG CAPS Take 0.4 mg by mouth daily.      valsartan (DIOVAN) 160 MG tablet Take 160 mg by mouth daily.     tadalafil (CIALIS) 5 MG tablet Take 1 tablet (5 mg total) by mouth daily as needed for erectile dysfunction. (Patient not taking: Reported on 12/24/2022) 30 tablet 11   No current facility-administered medications for this visit.    Allergies:   Patient has no known allergies.   Social History:  The patient  reports that he has quit smoking. His smoking use included cigars and cigarettes. He smoked an average of 2 packs per day. He has never used smokeless tobacco. He reports current alcohol use. He reports that he does not use drugs.   Family History:   family history includes Multiple sclerosis in his brother.    Review of Systems: Review of Systems  Constitutional: Negative.   HENT: Negative.    Respiratory: Negative.    Cardiovascular: Negative.   Gastrointestinal: Negative.   Musculoskeletal: Negative.   Neurological: Negative.   Psychiatric/Behavioral: Negative.    All other systems reviewed and are negative.    PHYSICAL EXAM: VS:  BP 136/76 (BP Location: Right Arm, Patient Position: Sitting, Cuff Size: Normal)   Pulse 74   Ht '6\' 2"'$  (1.88 m)   Wt 262 lb 2 oz (118.9 kg)   SpO2 98%   BMI 33.65 kg/m  , BMI Body mass index is 33.65 kg/m. GEN: Well nourished, well developed, in no acute distress HEENT: normal Neck: no JVD, carotid bruits, or masses Cardiac: RRR; 1/6 systolic ejection murmur appreciated right sternal border No rubs, or gallops,no edema  Respiratory:  clear to auscultation bilaterally, normal work of breathing GI: soft, nontender, nondistended, + BS MS: no deformity or atrophy Skin: warm and dry, no rash Neuro:  Strength and sensation are intact Psych: euthymic mood, full affect   Recent Labs: No results found for requested labs within last 365 days.    Lipid Panel No results found for: "CHOL", "HDL", "LDLCALC", "TRIG"    Wt Readings from Last 3 Encounters:  12/24/22 262 lb 2  oz (118.9 kg)  11/09/21 244 lb 11.2 oz (111 kg)  08/15/21 243 lb 12.8 oz (110.6 kg)       ASSESSMENT AND PLAN:  Problem List Items Addressed This Visit       Cardiology Problems   Atrial fibrillation with rapid ventricular response (HCC)   Arteriosclerosis of coronary artery - Primary   HLD (hyperlipidemia)   Hypertension   Aortic stenosis     Other   S/P CABG x 3 06/13/2021   Presence of stent in coronary artery   Coronary disease with stable angina Prior history of stent 2014, CABG 2022 three-vessel LIMA to the LAD, vein graft x 2 Denies significant anginal symptoms Active at baseline, hunting and fishing Cholesterol low, at goal, A1c well-controlled Non-smoker No further testing needed at this time  Bioprosthetic aortic valve For aortic valve stenosis dating back to 2010 or before  Last echocardiogram appears to be TEE July 2022, no echocardiogram since that time Would consider repeat echocardiogram and follow-up to evaluate aortic valve prosthesis Denies shortness of breath on exertion, no leg swelling no PND orthopnea  Hyperlipidemia Cholesterol is at goal on the current lipid regimen. No changes to the medications were made.  Essential hypertension Blood pressure is well controlled on today's visit. No changes made to the medications.   Records requested from [facility] and reviewed for today's visit  Total encounter time more than 60 minutes  Greater than 50% was spent in counseling and coordination of care with the patient    Signed, Esmond Plants, M.D., Ph.D. [place], [place]

## 2022-12-24 ENCOUNTER — Ambulatory Visit: Payer: Medicare Other | Attending: Cardiovascular Disease | Admitting: Cardiovascular Disease

## 2022-12-24 ENCOUNTER — Encounter: Payer: Self-pay | Admitting: Cardiovascular Disease

## 2022-12-24 VITALS — BP 136/76 | HR 74 | Ht 74.0 in | Wt 262.1 lb

## 2022-12-24 DIAGNOSIS — I350 Nonrheumatic aortic (valve) stenosis: Secondary | ICD-10-CM | POA: Diagnosis not present

## 2022-12-24 DIAGNOSIS — I2510 Atherosclerotic heart disease of native coronary artery without angina pectoris: Secondary | ICD-10-CM | POA: Diagnosis not present

## 2022-12-24 DIAGNOSIS — Z951 Presence of aortocoronary bypass graft: Secondary | ICD-10-CM | POA: Diagnosis not present

## 2022-12-24 DIAGNOSIS — Z955 Presence of coronary angioplasty implant and graft: Secondary | ICD-10-CM

## 2022-12-24 DIAGNOSIS — E782 Mixed hyperlipidemia: Secondary | ICD-10-CM | POA: Diagnosis present

## 2022-12-24 DIAGNOSIS — I4891 Unspecified atrial fibrillation: Secondary | ICD-10-CM

## 2022-12-24 DIAGNOSIS — I10 Essential (primary) hypertension: Secondary | ICD-10-CM

## 2022-12-24 MED ORDER — PRALUENT 150 MG/ML ~~LOC~~ SOAJ: 1.0000 | SUBCUTANEOUS | 5 refills | Status: DC

## 2022-12-24 MED ORDER — AMLODIPINE BESYLATE 5 MG PO TABS: 5.0000 mg | ORAL_TABLET | Freq: Every day | ORAL | 1 refills | Status: DC

## 2022-12-24 MED ORDER — METOPROLOL TARTRATE 25 MG PO TABS: 25.0000 mg | ORAL_TABLET | Freq: Two times a day (BID) | ORAL | 1 refills | Status: DC

## 2022-12-24 MED ORDER — ATORVASTATIN CALCIUM 80 MG PO TABS: 80.0000 mg | ORAL_TABLET | Freq: Every day | ORAL | 1 refills | Status: DC

## 2022-12-24 MED ORDER — NITROGLYCERIN 0.4 MG SL SUBL: 0.4000 mg | SUBLINGUAL_TABLET | SUBLINGUAL | 1 refills | Status: DC | PRN

## 2022-12-24 MED ORDER — VALSARTAN 160 MG PO TABS: 160.0000 mg | ORAL_TABLET | Freq: Every day | ORAL | 1 refills | Status: DC

## 2022-12-24 NOTE — Patient Instructions (Addendum)
Office fax # [phone]  Medication Instructions:  No changes  If you need a refill on your cardiac medications before your next appointment, please call your pharmacy.   Lab work: No new labs needed  Testing/Procedures: No new testing needed  Follow-Up: At [facility], you and your health needs are our priority.  As part of our continuing mission to provide you with exceptional heart care, we have created designated Provider Care Teams.  These Care Teams include your primary Cardiologist (physician) and Advanced Practice Providers (APPs -  Physician Assistants and Nurse Practitioners) who all work together to provide you with the care you need, when you need it.  You will need a follow up appointment in 6 months  Providers on your designated Care Team:   Murray Hodgkins, NP Christell Faith, PA-C Cadence Kathlen Mody, [place]  COVID-19 Vaccine Information can be found at: [email or web address].uk For questions related to vaccine distribution or appointments, please email [email or web address] or call [phone].

## 2023-04-03 ENCOUNTER — Encounter: Payer: Self-pay | Admitting: Cardiovascular Disease

## 2023-07-01 ENCOUNTER — Encounter: Payer: Self-pay | Admitting: Cardiovascular Disease

## 2023-10-30 ENCOUNTER — Other Ambulatory Visit (HOSPITAL_COMMUNITY): Payer: Self-pay

## 2023-10-30 ENCOUNTER — Telehealth: Payer: Self-pay | Admitting: Pharmacy Technician

## 2023-10-30 NOTE — Telephone Encounter (Signed)
Pharmacy Patient Advocate Encounter   Received notification from CoverMyMeds that prior authorization for praluent is required/requested.   Insurance verification completed.   The patient is insured through  [employer]  .   Per test claim: PA required; PA submitted to above mentioned insurance via CoverMyMeds Key/confirmation #/EOC [id number] Status is pending

## 2023-11-01 NOTE — Telephone Encounter (Signed)
Pharmacy Patient Advocate Encounter  Received notification from  [facility]  that Prior Authorization for praluent has been DENIED.  See denial reason below. No denial letter attached in CMM. Will attach denial letter to Media tab once received.   PA #/Case ID/Reference #: [id number]

## 2023-11-03 NOTE — Telephone Encounter (Signed)
Please complete PA for Repatha 

## 2023-11-04 ENCOUNTER — Other Ambulatory Visit (HOSPITAL_COMMUNITY): Payer: Self-pay

## 2023-11-04 ENCOUNTER — Telehealth: Payer: Self-pay | Admitting: Pharmacy Technician

## 2023-11-04 DIAGNOSIS — Z951 Presence of aortocoronary bypass graft: Secondary | ICD-10-CM

## 2023-11-04 DIAGNOSIS — E782 Mixed hyperlipidemia: Secondary | ICD-10-CM

## 2023-11-04 DIAGNOSIS — I2510 Atherosclerotic heart disease of native coronary artery without angina pectoris: Secondary | ICD-10-CM

## 2023-11-04 NOTE — Telephone Encounter (Signed)
Pharmacy Patient Advocate Encounter  Received notification from [facility] that Prior Authorization for repatha has been APPROVED from 11/04/23 to 11/03/24. Ran test claim, Copay is $88.35- one month. This test claim was processed through [facility]- copay amounts may vary at other pharmacies due to pharmacy/plan contracts, or as the patient moves through the different stages of their insurance plan.   PA #/Case ID/Reference #: [id number]

## 2023-11-04 NOTE — Telephone Encounter (Signed)
Pharmacy Patient Advocate Encounter   Received notification from Pt Calls Messages that prior authorization for repatha is required/requested.   Insurance verification completed.   The patient is insured through [facility] .   Per test claim: PA required; PA submitted to above mentioned insurance via CoverMyMeds Key/confirmation #/EOC [facility] Status is pending

## 2023-11-05 MED ORDER — REPATHA SURECLICK 140 MG/ML ~~LOC~~ SOAJ: 1.0000 mL | SUBCUTANEOUS | 5 refills | Status: DC

## 2023-11-05 NOTE — Addendum Note (Signed)
Addended by: Cheree Ditto on: 11/05/2023 08:17 AM   Modules accepted: Orders

## 2023-11-13 ENCOUNTER — Telehealth: Payer: Self-pay | Admitting: Cardiovascular Disease

## 2023-11-13 NOTE — Telephone Encounter (Signed)
Left a message for the patient to call back.  

## 2023-11-13 NOTE — Telephone Encounter (Signed)
Pt c/o medication issue:  1. Name of Medication:    2. How are you currently taking this medication (dosage and times per day)?    3. Are you having a reaction (difficulty breathing--STAT)? no  4. What is your medication issue? Patient wants to stick to this medication, he states that he working. He understand that his insurance as denied it, but states that he will pay out pocket for that particular medication. Please advise

## 2023-11-15 MED ORDER — PRALUENT 150 MG/ML ~~LOC~~ SOAJ: 150.0000 mg | SUBCUTANEOUS | 3 refills | Status: DC

## 2023-11-15 NOTE — Telephone Encounter (Signed)
Call placed back to the patient. He stated that his insurance denied the Praluent so he was switched to Repatha. He would like to be switched back to Praluent. He will pay for the Praluent regardless of the price.

## 2023-11-15 NOTE — Telephone Encounter (Signed)
Called patient and notified him of the following.  We can send in the Praluent at previous dosing It is possible that it may get picked up in 2025 with better coverage Thx TGollan       Prescription sent to preferred pharmacy.

## 2023-12-23 ENCOUNTER — Other Ambulatory Visit: Payer: Self-pay | Admitting: Cardiovascular Disease

## 2023-12-25 ENCOUNTER — Other Ambulatory Visit: Payer: Self-pay | Admitting: Cardiovascular Disease

## 2023-12-25 ENCOUNTER — Telehealth: Payer: Self-pay | Admitting: Cardiovascular Disease

## 2023-12-25 ENCOUNTER — Other Ambulatory Visit: Payer: Self-pay

## 2023-12-25 MED ORDER — ATORVASTATIN CALCIUM 80 MG PO TABS: 80.0000 mg | ORAL_TABLET | Freq: Every day | ORAL | 0 refills | Status: DC

## 2023-12-25 NOTE — Telephone Encounter (Signed)
RX sent to the pharmacy for 90 day. Aware to keep upcoming appointment in April for further refills.

## 2023-12-25 NOTE — Telephone Encounter (Signed)
Pt c/o medication issue:  1. Name of Medication:   atorvastatin (LIPITOR) 80 MG tablet    2. How are you currently taking this medication (dosage and times per day)?   3. Are you having a reaction (difficulty breathing--STAT)?   4. What is your medication issue? Pt's pharmacy is calling to get quantity of this medication changed to 90 day since pt will be traveling to [facility] until min April.

## 2024-03-30 ENCOUNTER — Other Ambulatory Visit: Payer: Self-pay | Admitting: Cardiovascular Disease

## 2024-03-31 ENCOUNTER — Ambulatory Visit: Payer: Medicare Other | Admitting: Cardiovascular Disease

## 2024-04-22 ENCOUNTER — Other Ambulatory Visit: Payer: Self-pay | Admitting: Cardiovascular Disease

## 2024-04-22 NOTE — Progress Notes (Signed)
 Cardiology Office Note  Date:  04/23/2024   ID:  Adam Baiz., DOB 1946/07/06, MRN [id number]  PCP:  Melchor Spoon, MD   Chief Complaint  Patient presents with   6 month follow up     "Doing well."     HPI:  Adam Zavala is a 78 year old gentleman with past medical history of CAD, s/p DES stent 03/2013,  aortic valve stenosis s/p Hancock porcine valve replacement in April 2014  Paroxysmal atrial fibrillation, postop CABG s/p CABG x3 (LIMA-LAD, SVG-OM1, SVG-PDA) on 06/13/21  aortic valve replacement for valve stenosis  with bioprosthetic valve in 2015, [facility] hypertension,  OSA on CPAP,  hyperlipidemia.  Former smoker quit age 37 Who presents  for f/u PAF, coronary disease, bioprosthetic aortic valve  Last seen by myself January 2024 Active, naps daily Exercises , walks 1 mile Denies chest pain or shortness of breath concerning for angina  Prior cardiac history reviewed DES stent placed April 2014, aortic valve replacement with porcine prosthesis April 2014  bypass grafting x 3 in 06/2021  Postoperative atrial fibrillation  spends half the year in [place] in warmer weather Continues to work part-time  On Praluent   With statin for lipid management A1c less than 6  EKG personally reviewed by myself on todays visit EKG Interpretation Date/Time:  Thursday Apr 23 2024 09:33:49 EDT Ventricular Rate:  54 PR Interval:  198 QRS Duration:  108 QT Interval:  452 QTC Calculation: 428 R Axis:   -20  Text Interpretation: Sinus bradycardia Cannot rule out Anterior infarct (cited on or before 23-Jun-2021) When compared with ECG of 23-Jun-2021 23:33, Sinus rhythm has replaced Atrial fibrillation Vent. rate has decreased BY 120 BPM QRS duration has increased T wave inversion less evident in Inferior leads Nonspecific T wave abnormality no longer evident in Lateral leads Confirmed by Belva Boyden [phone]) on 04/23/2024 9:55:03 AM   Other records reviewed cardiac  catheterization from July 2022 showed progressive multivessel coronary artery obstructive disease deemed not amenable to further PCI (severe left main bifurcation disease with 60-70% RCA).     EKG personally reviewed by myself on todays visit Normal sinus rhythm rate 74 bpm consider old anterior MI, left axis deviation  PMH:   has a past medical history of A-fib [facility]), Aortic stenosis, Aortic valve replaced (03/2013), Coronary artery disease, Dysrhythmia, Elevated PSA, GERD (gastroesophageal reflux disease), Heart attack (HCC), Heart murmur, Hyperlipidemia, Hypertension, Pneumonia, and Sleep apnea.  PSH:    Past Surgical History:  Procedure Laterality Date   achilles tendon rupture repair     AORTIC VALVE SURGERY     CARDIAC VALVE REPLACEMENT  03/13/2013   Aortic  Hancock II Porcine Heart Valve   CHOLECYSTECTOMY     COLON SURGERY     COLONOSCOPY W/ BIOPSIES AND POLYPECTOMY     3 polyps removed   COLONOSCOPY WITH PROPOFOL  N/A 04/16/2018   Procedure: COLONOSCOPY WITH PROPOFOL ;  Surgeon: Cassie Click, MD;  Location: [facility] ENDOSCOPY;  Service: Endoscopy;  Laterality: N/A;   CORONARY ANGIOPLASTY WITH STENT PLACEMENT  02/05/2014   2.75 mm x 14 mm   EPIDIDYMECTOMY Right 11/12/2016   Procedure: PARTIAL EPIDIDYMECTOMY;  Surgeon: Dustin Gimenez, MD;  Location: [facility] ORS;  Service: Urology;  Laterality: Right;   partial removal of colon     PROSTATE BIOPSY     right shoulder surgery     separation/ dislocated   ROTATOR CUFF REPAIR     S/p rectotomy for fissures  SPHINCTEROTOMY     VASECTOMY Right 11/12/2016   Procedure: VASECTOMY;  Surgeon: Dustin Gimenez, MD;  Location: [facility] ORS;  Service: Urology;  Laterality: Right;    Current Outpatient Medications  Medication Sig Dispense Refill   Alirocumab  (PRALUENT ) 150 MG/ML SOAJ Inject 1 mL (150 mg total) into the skin every 14 (fourteen) days. 6 mL 3   amLODipine  (NORVASC ) 5 MG tablet TAKE 1 TABLET BY MOUTH DAILY  90 tablet 0    aspirin 81 MG tablet Take 81 mg by mouth daily.     atorvastatin  (LIPITOR) 80 MG tablet Take 1 tablet (80 mg total) by mouth daily. 90 tablet 0   esomeprazole (NEXIUM) 40 MG capsule Take 40 mg by mouth daily before breakfast.     metoprolol  tartrate (LOPRESSOR ) 25 MG tablet TAKE ONE (1) TABLET BY MOUTH TWO TIMES PER DAY 60 tablet 0   nitroGLYCERIN  (NITROSTAT ) 0.4 MG SL tablet Place 1 tablet (0.4 mg total) under the tongue every 5 (five) minutes x 3 doses as needed for chest pain. 25 tablet 1   Tamsulosin HCl (FLOMAX) 0.4 MG CAPS Take 0.4 mg by mouth daily.     valsartan  (DIOVAN ) 160 MG tablet Take 1 tablet (160 mg total) by mouth daily. 90 tablet 1   meloxicam (MOBIC) 15 MG tablet Take 15 mg by mouth daily. (Patient not taking: Reported on 04/23/2024)     No current facility-administered medications for this visit.    Allergies:   Lisinopril    Social History:  The patient  reports that he has quit smoking. His smoking use included cigars and cigarettes. He has never used smokeless tobacco. He reports current alcohol use. He reports that he does not use drugs.   Family History:   family history includes Multiple sclerosis in his brother.    Review of Systems: Review of Systems  Constitutional: Negative.   HENT: Negative.    Respiratory: Negative.    Cardiovascular: Negative.   Gastrointestinal: Negative.   Musculoskeletal: Negative.   Neurological: Negative.   Psychiatric/Behavioral: Negative.    All other systems reviewed and are negative.   PHYSICAL EXAM: VS:  BP 118/68 (BP Location: Left Arm, Patient Position: Sitting, Cuff Size: Normal)   Pulse (!) 54   Ht 6\' 2"  (1.88 m)   Wt 255 lb 8 oz (115.9 kg)   BMI 32.80 kg/m  , BMI Body mass index is 32.8 kg/m. GEN: Well nourished, well developed, in no acute distress HEENT: normal Neck: no JVD, carotid bruits, or masses Cardiac: RRR; 1/6 systolic ejection murmur appreciated right sternal border No rubs, or gallops,no edema   Respiratory:  clear to auscultation bilaterally, normal work of breathing GI: soft, nontender, nondistended, + BS MS: no deformity or atrophy Skin: warm and dry, no rash Neuro:  Strength and sensation are intact Psych: euthymic mood, full affect   Recent Labs: No results found for requested labs within last 365 days.    Lipid Panel No results found for: "CHOL", "HDL", "LDLCALC", "TRIG"    Wt Readings from Last 3 Encounters:  04/23/24 255 lb 8 oz (115.9 kg)  12/24/22 262 lb 2 oz (118.9 kg)  11/09/21 244 lb 11.2 oz (111 kg)     ASSESSMENT AND PLAN:  Problem List Items Addressed This Visit       Cardiology Problems   Atrial fibrillation with rapid ventricular response (HCC)   Relevant Orders   EKG 12-Lead (Completed)   Arteriosclerosis of coronary artery - Primary   Relevant Orders  EKG 12-Lead (Completed)   HLD (hyperlipidemia)   Hypertension   Relevant Orders   EKG 12-Lead (Completed)   Aortic stenosis   Relevant Orders   EKG 12-Lead (Completed)     Other   S/P CABG x 3 06/13/2021    Coronary disease with stable angina Prior history of stent 2014, CABG 2022 three-vessel LIMA to the LAD, vein graft x 2 Currently with no symptoms of angina. No further workup at this time. Continue current medication regimen. Cholesterol low, at goal, A1c well-controlled Non-smoker No further testing needed  Bioprosthetic aortic valve Last echocardiogram appears to be TEE July 2022,  No significant murmur on exam Repeat echocardiogram ordered to evaluate prosthetic valve  Hyperlipidemia Cholesterol is at goal on the current lipid regimen. No changes to the medications were made.  Essential hypertension Blood pressure is well controlled on today's visit. No changes made to the medications.    Signed, Juanda Noon, M.D., Ph.D. [facility] [place], [place] [phone]

## 2024-04-23 ENCOUNTER — Encounter: Payer: Self-pay | Admitting: Cardiovascular Disease

## 2024-04-23 ENCOUNTER — Other Ambulatory Visit (HOSPITAL_COMMUNITY): Payer: Self-pay

## 2024-04-23 ENCOUNTER — Ambulatory Visit: Attending: Cardiovascular Disease | Admitting: Cardiovascular Disease

## 2024-04-23 ENCOUNTER — Telehealth: Payer: Self-pay | Admitting: Pharmacy Technician

## 2024-04-23 VITALS — BP 118/68 | HR 54 | Ht 74.0 in | Wt 255.5 lb

## 2024-04-23 DIAGNOSIS — I10 Essential (primary) hypertension: Secondary | ICD-10-CM | POA: Insufficient documentation

## 2024-04-23 DIAGNOSIS — I4891 Unspecified atrial fibrillation: Secondary | ICD-10-CM | POA: Insufficient documentation

## 2024-04-23 DIAGNOSIS — I350 Nonrheumatic aortic (valve) stenosis: Secondary | ICD-10-CM | POA: Diagnosis present

## 2024-04-23 DIAGNOSIS — E782 Mixed hyperlipidemia: Secondary | ICD-10-CM | POA: Insufficient documentation

## 2024-04-23 DIAGNOSIS — I2510 Atherosclerotic heart disease of native coronary artery without angina pectoris: Secondary | ICD-10-CM | POA: Diagnosis present

## 2024-04-23 DIAGNOSIS — Z953 Presence of xenogenic heart valve: Secondary | ICD-10-CM | POA: Insufficient documentation

## 2024-04-23 DIAGNOSIS — Z951 Presence of aortocoronary bypass graft: Secondary | ICD-10-CM | POA: Insufficient documentation

## 2024-04-23 MED ORDER — PRALUENT 150 MG/ML ~~LOC~~ SOAJ: 150.0000 mg | SUBCUTANEOUS | 3 refills | Status: AC

## 2024-04-23 MED ORDER — METOPROLOL TARTRATE 25 MG PO TABS: 25.0000 mg | ORAL_TABLET | Freq: Two times a day (BID) | ORAL | 3 refills | Status: DC

## 2024-04-23 MED ORDER — NITROGLYCERIN 0.4 MG SL SUBL: 0.4000 mg | SUBLINGUAL_TABLET | SUBLINGUAL | 1 refills | Status: AC | PRN

## 2024-04-23 MED ORDER — VALSARTAN 160 MG PO TABS: 160.0000 mg | ORAL_TABLET | Freq: Every day | ORAL | 3 refills | Status: DC

## 2024-04-23 MED ORDER — AMLODIPINE BESYLATE 5 MG PO TABS: 5.0000 mg | ORAL_TABLET | Freq: Every day | ORAL | 3 refills | Status: DC

## 2024-04-23 MED ORDER — ATORVASTATIN CALCIUM 80 MG PO TABS: 80.0000 mg | ORAL_TABLET | Freq: Every day | ORAL | 3 refills | Status: AC

## 2024-04-23 NOTE — Telephone Encounter (Signed)
 Pharmacy Patient Advocate Encounter   Received notification from Fax that prior authorization for Praluent  is required/requested.   Insurance verification completed.   The patient is insured through [employer] .   Per test claim:  Repatha  is preferred by the insurance.  If suggested medication is appropriate, Please send in a new RX and discontinue this one. If not, please advise as to why it's not appropriate so that we may request a Prior Authorization. Please note, some preferred medications may still require a PA.  If the suggested medications have not been trialed and there are no contraindications to their use, the PA will not be submitted, as it will not be approved. PA submitted but may result in denial as there is no allergy to repatha 

## 2024-04-23 NOTE — Patient Instructions (Addendum)
 Medication Instructions:  No changes  If you need a refill on your cardiac medications before your next appointment, please call your pharmacy.   Lab work: No new labs needed  Testing/Procedures: Your physician has requested that you have an echocardiogram. Echocardiography is a painless test that uses sound waves to create images of your heart. It provides your doctor with information about the size and shape of your heart and how well your heart's chambers and valves are working.   You may receive an ultrasound enhancing agent through an IV if needed to better visualize your heart during the echo. This procedure takes approximately one hour.  There are no restrictions for this procedure.  This will take place at [address] [facility] [address] [facility]) [address], [place] [address]  Please note: We ask at that you not bring children with you during ultrasound (echo/ vascular) testing. Due to room size and safety concerns, children are not allowed in the ultrasound rooms during exams. Our front office staff cannot provide observation of children in our lobby area while testing is being conducted. An adult accompanying a patient to their appointment will only be allowed in the ultrasound room at the discretion of the ultrasound technician under special circumstances. We apologize for any inconvenience.   Follow-Up: At [facility], you and your health needs are our priority.  As part of our continuing mission to provide you with exceptional heart care, we have created designated Provider Care Teams.  These Care Teams include your primary Cardiologist (physician) and Advanced Practice Providers (APPs -  Physician Assistants and Nurse Practitioners) who all work together to provide you with the care you need, when you need it.  You will need a follow up appointment in 12 months  Providers on your designated Care Team:   Nicolasa Ducking, NP Eula Listen, PA-C Cadence Fransico Michael, [place]  COVID-19  Vaccine Information can be found at: [email or web address] For questions related to vaccine distribution or appointments, please email [email or web address] or call 5071822420.

## 2024-04-24 ENCOUNTER — Encounter: Payer: Self-pay | Admitting: Cardiovascular Disease

## 2024-06-01 ENCOUNTER — Other Ambulatory Visit: Payer: Self-pay | Admitting: Emergency Medicine

## 2024-06-01 ENCOUNTER — Telehealth: Payer: Self-pay | Admitting: Cardiovascular Disease

## 2024-06-01 NOTE — Telephone Encounter (Signed)
 Pt c/o medication issue:  1. Name of Medication: metoprolol  tartrate (LOPRESSOR ) 25 MG tablet   2. How are you currently taking this medication (dosage and times per day)? N/A   3. Are you having a reaction (difficulty breathing--STAT)? No   4. What is your medication issue? Pts PCP would like to make our office aware that this medication dosage was changed to 50 MG while pt was hospital.

## 2024-06-02 NOTE — Telephone Encounter (Signed)
 Called patient and left message for call back.

## 2024-06-02 NOTE — Telephone Encounter (Signed)
 Called patient in regards to the following from Dr. Gollan.  I cannot see any hospital records Perhaps he was in the hospital in [place]? Can he if he send us  some blood pressure and heart rate numbers to make sure he is not overmedicated He is taking metoprolol  50 twice daily currently with amlodipine  and valsartan ? Thx TGollan  Patient verbalizes understanding. Patient states that he is currently taking Amlodipine  5 MG daily, Valsartan  160 MG daily and Metoprolol  Tartrate 50 MG twice daily.  Patient will monitor blood pressure and heart rates and send a log in.

## 2024-06-02 NOTE — Telephone Encounter (Signed)
 Patient is returning phone call.

## 2024-06-11 ENCOUNTER — Other Ambulatory Visit (HOSPITAL_COMMUNITY): Payer: Self-pay

## 2024-06-11 ENCOUNTER — Telehealth: Payer: Self-pay | Admitting: Pharmacy Technician

## 2024-06-11 NOTE — Telephone Encounter (Signed)
 Pharmacy Patient Advocate Encounter   Received notification from Fax that prior authorization for Praluent  150MG  is required/requested.   Insurance verification completed.   The patient is insured through [employer] .   Per test claim: PA required; PA submitted to above mentioned insurance via CoverMyMeds Key/confirmation #/EOC  [id number] Status is pending

## 2024-06-15 NOTE — Telephone Encounter (Signed)
 Pharmacy Patient Advocate Encounter  Received notification from [employer] that Prior Authorization for Praluent  has been DENIED.  Full denial letter will be uploaded to the media tab. See denial reason below. Repatha  preferred    PA #/Case ID/Reference #: [id number]

## 2024-07-09 ENCOUNTER — Encounter: Payer: Self-pay | Admitting: Cardiovascular Disease

## 2024-07-13 ENCOUNTER — Ambulatory Visit: Attending: Cardiovascular Disease

## 2024-07-13 DIAGNOSIS — Z953 Presence of xenogenic heart valve: Secondary | ICD-10-CM | POA: Diagnosis not present

## 2024-07-13 LAB — ECHOCARDIOGRAM COMPLETE
AR max vel: 1.76 cm2
AV Area VTI: 1.88 cm2
AV Area mean vel: 1.79 cm2
AV Mean grad: 9 mmHg
AV Peak grad: 17.8 mmHg
Ao pk vel: 2.11 m/s
Area-P 1/2: 3.72 cm2
S' Lateral: 2.7 cm

## 2024-07-14 ENCOUNTER — Ambulatory Visit: Payer: Self-pay | Admitting: Cardiovascular Disease

## 2024-07-14 ENCOUNTER — Other Ambulatory Visit: Payer: Self-pay | Admitting: Emergency Medicine

## 2024-07-14 ENCOUNTER — Encounter: Payer: Self-pay | Admitting: Cardiology

## 2024-07-14 ENCOUNTER — Ambulatory Visit: Attending: Cardiology | Admitting: Cardiology

## 2024-07-14 VITALS — BP 134/70 | HR 50 | Ht 74.0 in | Wt 258.2 lb

## 2024-07-14 DIAGNOSIS — I10 Essential (primary) hypertension: Secondary | ICD-10-CM | POA: Insufficient documentation

## 2024-07-14 DIAGNOSIS — I440 Atrioventricular block, first degree: Secondary | ICD-10-CM | POA: Diagnosis present

## 2024-07-14 DIAGNOSIS — I350 Nonrheumatic aortic (valve) stenosis: Secondary | ICD-10-CM | POA: Insufficient documentation

## 2024-07-14 DIAGNOSIS — Z953 Presence of xenogenic heart valve: Secondary | ICD-10-CM | POA: Insufficient documentation

## 2024-07-14 DIAGNOSIS — Z951 Presence of aortocoronary bypass graft: Secondary | ICD-10-CM | POA: Diagnosis present

## 2024-07-14 DIAGNOSIS — E782 Mixed hyperlipidemia: Secondary | ICD-10-CM | POA: Insufficient documentation

## 2024-07-14 DIAGNOSIS — R001 Bradycardia, unspecified: Secondary | ICD-10-CM | POA: Diagnosis present

## 2024-07-14 DIAGNOSIS — Z79899 Other long term (current) drug therapy: Secondary | ICD-10-CM | POA: Diagnosis not present

## 2024-07-14 DIAGNOSIS — I2510 Atherosclerotic heart disease of native coronary artery without angina pectoris: Secondary | ICD-10-CM | POA: Diagnosis not present

## 2024-07-14 MED ORDER — METOPROLOL TARTRATE 25 MG PO TABS: ORAL_TABLET | ORAL | 3 refills | Status: DC

## 2024-07-14 MED ORDER — METOPROLOL TARTRATE 25 MG PO TABS: 25.0000 mg | ORAL_TABLET | Freq: Every morning | ORAL | 3 refills | Status: DC

## 2024-07-14 MED ORDER — VALSARTAN 160 MG PO TABS: 240.0000 mg | ORAL_TABLET | Freq: Every day | ORAL | 3 refills | Status: DC

## 2024-07-14 NOTE — Patient Instructions (Signed)
 Medication Instructions:  Your physician recommends the following medication changes.  START TAKING: Metoprolol  25 mg in the AM Metoprolol  50 mg in the PM Valsartan  240 mg daily  *If you need a refill on your cardiac medications before your next appointment, please call your pharmacy*  Lab Work: Your provider would like for you to return in 2 - 3 weeks to have the following labs drawn: BMP.   Please go to [facility] [address] ([facility] [address] [facility]) [address], [place] [address] You do not need an appointment.  They are open from 8 am- 4:30 pm.  Lunch from 1:00 pm- 2:00 pm You DO NOT need to be fasting.   You may also go to one of the following [facility]:  [address] [place], [place] [address] Phone: [phone] Lab hours: Mon-Fri 8 am- 5 pm    Lunch 12 pm- 1 pm  [address] [place],  [place]  [address]  [place]  Phone: [phone] Lab hours: 7 am- 4 pm Lunch 12 pm-1 pm   [address] [place],  [place]  [address]  [place]  Phone: [phone] Lab hours: Mon-Fri 8 am- 5 pm    Lunch 12 pm- 1 pm  If you have labs (blood work) drawn today and your tests are completely normal, you will receive your results only by: MyChart Message (if you have MyChart) OR A paper copy in the mail If you have any lab test that is abnormal or we need to change your treatment, we will call you to review the results.  Testing/Procedures: No test ordered today   Follow-Up: At [facility], you and your health needs are our priority.  As part of our continuing mission to provide you with exceptional heart care, our providers are all part of one team.  This team includes your primary Cardiologist (physician) and Advanced Practice Providers or APPs (Physician Assistants and Nurse Practitioners) who all work together to provide you with the care you need, when you need it.  Your next appointment:   6 - 8 week(s)  Provider:   Evalene Lunger, MD or Tylene Lunch, NP

## 2024-07-14 NOTE — Progress Notes (Signed)
 Cardiology Office Note   Date:  07/14/2024  ID:  Adam Zavala., DOB 10-Aug-1946, MRN [id number] PCP: Adam Ophelia JINNY DOUGLAS, MD  [facility] Providers Cardiologist:  Adam Lunger, MD Cardiology APP:  Adam Frederick, NP     History of Present Illness Adam Zavala. is a 78 y.o. male with a past medical history of coronary artery disease status post PCI/DES (03/2013), aortic valve stenosis status post AVR with porcine valve (01/2013), paroxysmal atrial fibrillation, status post CABG x 3 (LIMA-LAD, SVG-OM1, SVG-PDA, 06/13/2021), aortic valve replacement for valve stenosis of bioprosthetic valve (2015), hypertension, OSA on CPAP, hyperlipidemia, former smoker, who presents today for follow-up.   Extensive prior cardiac history with stent placement in 2014 as well as aortic valve replacement with porcine prosthesis in 2014.  He underwent additional aortic valve replacement for valve stenosis with bioprosthetic valve in 2015.  Cardiac catheterization in July 2022 showed progressive multivessel coronary artery disease deemed not amendable to further PCI (severe left main bifurcation disease with 60-70% RCA).  He underwent CABG x 3 in 06/2021 with LIMA to the LAD, SVG to OM1, and SVG-PDA.  Postoperative atrial fibrillation was noted.   He was last seen in clinic 04/23/2024 stating he was doing well from the cardiac perspective.  Denied any chest pain or shortness of breath concerning for angina.  Continues to spend half the year in [place] and worked part-time.  He was scheduled for an updated echocardiogram to evaluate his prosthetic valve.  There were no other medication changes that were made.  He returns to clinic today stating that he has been doing well from the cardiac perspective.  He denies any chest pain, shortness of breath, peripheral edema or lightheadedness.  He occasionally have bouts of palpitations.  States that he has been compliant with his current medication regimen without any undue  side effects.  Unfortunately he has noted an uptick in his blood pressure with related headaches when his blood pressure is 150s systolic.  He states that he had he was evaluated in the emergency department due to elevated blood pressure and it was decided that his metoprolol  tartrate would be increased from 25 mg twice daily to 50 mg twice daily.  He has noted a drop in his blood pressure but then it has been up and down.  He continues to be bradycardic on his dosing of metoprolol .  States that he resides half of the year a year and a half of the year and [place] they have a house in [place] and he has back and forth.  He is concerned with the ups and downs in his blood pressure and has several questions related to various medications and previous studies and his EKGs and echocardiograms today.  ROS: 10 point review of systems has been reviewed and considered negative the exception was been listed in the HPI  Studies Reviewed EKG Interpretation Date/Time:  Tuesday July 14 2024 10:24:56 EDT Ventricular Rate:  50 PR Interval:  212 QRS Duration:  104 QT Interval:  466 QTC Calculation: 424 R Axis:   -15  Text Interpretation: Sinus bradycardia with 1st degree A-V block When compared with ECG of 23-Apr-2024 09:33, No significant change was found Confirmed by Adam Zavala ([id number]) on 07/14/2024 10:49:22 AM    2D echo 07/13/2024 1. Left ventricular ejection fraction, by estimation, is 60 to 65%. The  left ventricle has normal function. The left ventricle has no regional  wall motion abnormalities. There is  mild left ventricular hypertrophy.  Left ventricular diastolic parameters  were normal.   2. Right ventricular systolic function is normal. The right ventricular  size is normal.   3. The mitral valve is normal in structure. No evidence of mitral valve  regurgitation.   4. The aortic valve has been repaired/replaced. Aortic valve  regurgitation is not visualized. There is a  bioprosthetic valve present in  the aortic position. Aortic valve mean gradient measures 9.0 mmHg.   5. Aortic dilatation noted. There is mild dilatation of the ascending  aorta, measuring 44 mm.   Risk Assessment/Calculations         Physical Exam VS:  BP 134/70 (BP Location: Left Arm, Patient Position: Sitting, Cuff Size: Normal)   Pulse (!) 50   Ht 6' 2 (1.88 m)   Wt 258 lb 3.2 oz (117.1 kg)   SpO2 99%   BMI 33.15 kg/m        Wt Readings from Last 3 Encounters:  07/14/24 258 lb 3.2 oz (117.1 kg)  04/23/24 255 lb 8 oz (115.9 kg)  12/24/22 262 lb 2 oz (118.9 kg)    GEN: Well nourished, well developed in no acute distress NECK: No JVD; No carotid bruits CARDIAC: RRR, I/VI systolic murmur RUSB, without rubs or gallops RESPIRATORY:  Clear to auscultation without rales, wheezing or rhonchi  ABDOMEN: Soft, non-tender, non-distended EXTREMITIES:  No edema; No deformity   ASSESSMENT AND PLAN Coronary artery disease of native coronary arteries without angina status post CABG x 3 vessel in 09/2021.  LIMA to the LAD and vein graft x 2.  Currently no symptoms of angina or anginal equivalents at this time.  EKG today reveals sinus bradycardia with a first-degree AV block with a rate of 50 and left axis deviation.  No need for any further ischemic workup at this time.  He is continued on aspirin 81 mg daily and Lipitor 80 mg daily as well as Praluent  150 mg every 2 weeks.  Severe aortic stenosis status post bioprosthetic aortic valve replacements.  Last echocardiogram was completed in 07/2024 which revealed LVEF 60 to 65%, no RWMA, mild LVH, right ventricular systolic function and size were normal, bioprosthetic valve present in the aortic position.  Aortic valve mean gradient measured 9 mmHg.  Aortic root dilatation was noted at 44 mm.  Recommend with annual echocardiograms.  Hyperlipidemia with last LDL 27.  He has been continued on atorvastatin  80 mg daily Praluent  150 mg every 2  weeks.  Primary hypertension blood pressure today 130/70.  Blood pressures remain stable.  He is continued on amlodipine  5 mg daily and Lopressor  25 mg in the a.m. 50 mg in the p.m. dose reduced today, and valsartan  increased to 240 mg today.  He has been encouraged to continue to monitor his pressure 1 to 2 hours postmedication administration at home and keep a log of his pressures to bring on return appointment.  Sinus bradycardia with first-degree AV block with a rate of 50 on EKG today.  First-degree AV block is new and right reduction in her bradycardia since increasing metoprolol .  Metoprolol  is being decreased from 50 mg twice daily to 25 mg in a.m. 50 mg in the p.m.  Will continue to monitor with surveillance electrocardiograms.  If patient continues to remain asymptomatic to have bradycardia.       Dispo: Patient to return to clinic to see MD/APP in 6 to 8 weeks or sooner if needed for reevaluation.  He was also requested  copies of his EKG and echocardiogram.  Signed, Yolande Skoda, NP

## 2024-07-20 ENCOUNTER — Other Ambulatory Visit: Payer: Self-pay | Admitting: *Deleted

## 2024-07-20 MED ORDER — METOPROLOL TARTRATE 25 MG PO TABS: ORAL_TABLET | ORAL | 3 refills | Status: AC

## 2024-07-20 NOTE — Telephone Encounter (Signed)
 Does he have any symptoms from a lower blood pressure? It is fine to take one in the morning and one in the evening. I would recommend that he take his blood pressure and pulse 1-2 hours after he has taken his medications in the morning.  His valsartan  was also increased due to elevated blood pressure and headaches. Drop back on the valsartan  from a pill and a half for 240 mg back down to 160 mg daily and continue with BID dosing of his metoprolol   25 mg. His blood pressures in the 90's systolic is a large drop from where he was previously. Have him send us  an update on his blood pressure and heart rate by Thursday of this week after the changes.

## 2024-07-20 NOTE — Telephone Encounter (Signed)
 Blood pressures remain stable with improvement in heart rate.  Continue with current medication dosing as previously discussed at his last visit.  No changes needed at this time.

## 2024-07-28 NOTE — Telephone Encounter (Signed)
 Blood pressures and heart rates look great.

## 2024-09-03 NOTE — Progress Notes (Unsigned)
 Cardiology Office Note   Date:  09/04/2024  ID:  Dasie FORBES Leobardo Mickey., DOB Jun 20, 1946, MRN [id number] PCP: Fernande Ophelia JINNY DOUGLAS, MD  [facility] Providers Cardiologist:  Evalene Lunger, MD Cardiology APP:  Gerard Frederick, NP     History of Present Illness Brandun Pinn. is a 78 y.o. male with a past medical history of coronary artery disease status post PCI/DES (03/2013), aortic valve stenosis status post AVR with porcine valve (01/2013), paroxysmal atrial fibrillation, status post CABG x 3 (LIMA-LAD, SVG/OM1, SVG-PDA, 06/13/2021), aortic valve replacement for valve stenosis of bioprosthetic valve (2015), hypertension, OSA on CPAP, hyperlipidemia, former smoker, presents today for follow-up.   Extensive prior cardiac history with stent placement in 2014 as well as aortic valve replacement with porcine prosthesis in 2014.  He underwent additional aortic valve replacement for valve stenosis with bioprosthetic valve in 2015.  Cardiac catheterization in July 2022 showed progressive multivessel coronary artery disease deemed not amendable to further PCI (severe left main bifurcation disease with 60-70% RCA).  He underwent CABG x 3 in 06/2021 with LIMA to the LAD, SVG to OM1, and SVG-PDA.  Postoperative atrial fibrillation was noted.  Previously was seen in clinic 04/22/2019 Tuesday was doing well for cardiac perspective.  Denied any chest pain shortness of breath concerning for angina.  Continue to spend half the year in [place] support part-time.  He is scheduled for an updated echocardiogram to evaluate his prosthetic valve.  There were no medication changes that were made.   He was last seen in clinic 07/14/2024.  At he stated he been doing fairly well from a cardiac perspective.  Denied any chest pain, shortness of breath or peripheral edema.  Occasional 7 bouts of palpitations.  States have been compliant with his current medication regimen without any undue side effects.  Unfortunately he had had an  uptick in his blood pressure with related headache when his blood pressure was around 150s systolic.  He states he been evaluated in the emergency department due to elevated blood pressure and decided that his metoprolol  tartrate will be increased from 25 mg twice daily to 50 mg twice daily.  He did drop his blood pressure and that have been up-and-down.  He continued to be bradycardic on his dosing of metoprolol .  Says he resides half the year here and another half of the year in [place] cystoscopy has in the clinic paged and he is back-and-forth.  He is concerned with the symptoms and his blood pressure and several concerns related to his medications.  Lopressor  dosing was reduced and valsartan  was increased.  He was encouraged to monitor his pressure 1 to 2 hours postmedication administration at home and keep a log of his blood pressure to bring with him on his return appointment.  He returns to clinic today stating that overall he has been doing fairly well.  He just returned back from a fishing trip in [place] .  He has noted that his blood pressures are better at 135-145/79-85.  Occasional sensations into his eyes that feel like negative shampoo or soap into his eyes when his blood pressure is elevated but not actual headaches.  Denies any other associated symptoms.  Denies any chest pain, shortness of breath, palpitations.  States that heart rate has been rather stable with occasional intake but no further limitations.  He continues to remain chronotropically competent.  States that he has been compliant with his current medication regimen without any undue side effects.  Denies any hospitalizations or visits to the emergency department.  ROS: 10 point review of system has been reviewed and considered negative except what is listed in the HPI  Studies Reviewed EKG Interpretation Date/Time:  Friday September 04 2024 08:00:35 EDT Ventricular Rate:  56 PR Interval:  194 QRS Duration:  104 QT  Interval:  448 QTC Calculation: 432 R Axis:   -18  Text Interpretation: Sinus bradycardia with Premature atrial complexes When compared with ECG of 14-Jul-2024 10:24, Premature atrial complexes are now Present Confirmed by Gerard Frederick ([id number]) on 09/04/2024 8:04:25 AM    2D echo 07/13/2024 1. Left ventricular ejection fraction, by estimation, is 60 to 65%. The  left ventricle has normal function. The left ventricle has no regional  wall motion abnormalities. There is mild left ventricular hypertrophy.  Left ventricular diastolic parameters  were normal.   2. Right ventricular systolic function is normal. The right ventricular  size is normal.   3. The mitral valve is normal in structure. No evidence of mitral valve  regurgitation.   4. The aortic valve has been repaired/replaced. Aortic valve  regurgitation is not visualized. There is a bioprosthetic valve present in  the aortic position. Aortic valve mean gradient measures 9.0 mmHg.   5. Aortic dilatation noted. There is mild dilatation of the ascending  aorta, measuring 44 mm.   Risk Assessment/Calculations   HYPERTENSION CONTROL Vitals:   09/04/24 0757 09/04/24 0805  BP: (!) 150/82 (!) 148/72    The patient's blood pressure is elevated above target today.  In order to address the patient's elevated BP: A current anti-hypertensive medication was adjusted today.          Physical Exam VS:  BP (!) 148/72 (BP Location: Left Arm, Patient Position: Sitting, Cuff Size: Normal)   Pulse (!) 56   Ht 6' 1.5 (1.867 m)   Wt 260 lb 6.4 oz (118.1 kg)   SpO2 97%   BMI 33.89 kg/m        Wt Readings from Last 3 Encounters:  09/04/24 260 lb 6.4 oz (118.1 kg)  07/14/24 258 lb 3.2 oz (117.1 kg)  04/23/24 255 lb 8 oz (115.9 kg)    GEN: Well nourished, well developed in no acute distress NECK: No JVD; No carotid bruits CARDIAC: RRR, I/VI systolic murmurs, rubs, gallops RESPIRATORY:  Clear to auscultation without rales, wheezing or  rhonchi  ABDOMEN: Soft, non-tender, non-distended EXTREMITIES:  No edema; No deformity   ASSESSMENT AND PLAN Coronary artery disease of native coronary arteries with stable angina status post CABG x 3 vessel in 09/2021.  LIMA to LAD and vein graft x 2.  Currently no symptoms of angina or anginal equivalents.  EKG today reveals sinus bradycardia with rate 56 and PACs with no significant changes.  He is continued on aspirin 81 mg daily and Lipitor 80 mg daily Praluent  150 mg every 2 weeks.  No further ischemic evaluation needed at this time.  Severe aortic stenosis status post bioprosthetic aortic valve replacements.  Last echocardiogram completed in 07/2024 with an LVEF of 60 to 65%, no RWMA, mild LVH, right ventricular systolic function signs are normal, bioprosthetic valve present in the aortic position, aortic valve mean gradient measured 9 mmHg, aortic root dilatation was -44 mm.  Continue annual studies.  Hyperlipidemia with last LDL 27.  Has been continued on atorvastatin  80 mg daily Praluent  150 mg every 2 weeks.  Primary hypertension with a blood pressure of 150/82 and recheck of 148/72.  Blood pressures  are slightly elevated today.  He is continued on amlodipine  5 mg in the evening, Lopressor  25 mg in the a.m. 50 mg in the p.m. and valsartan  240 mg daily.  We are also adding additional 2.5 mg of amlodipine  in the morning.  He is also being sent for an updated BMP today with it in the increase of valsartan  at his last visit.  He has been encouraged to continue to monitor his pressures 1 to 2 hours postmedication administration at home as well.  Sinus bradycardia with a rate of 56 and PACs noted on EKG today he is chronotropically competent.  Metoprolol  was continued at 25 mg a.m. 50 mg in p.m.  Will continue to monitor with surveillance EKGs.       Dispo: Patient to return to clinic to see MD/APP in 6 to 8 weeks or sooner if needed for reevaluation of blood pressure change to current medication  regimen today.  Signed, Nazar Kuan, NP

## 2024-09-04 ENCOUNTER — Encounter: Payer: Self-pay | Admitting: Cardiology

## 2024-09-04 ENCOUNTER — Ambulatory Visit: Attending: Cardiology | Admitting: Cardiology

## 2024-09-04 VITALS — BP 148/72 | HR 56 | Ht 73.5 in | Wt 260.4 lb

## 2024-09-04 DIAGNOSIS — I10 Essential (primary) hypertension: Secondary | ICD-10-CM | POA: Diagnosis present

## 2024-09-04 DIAGNOSIS — E782 Mixed hyperlipidemia: Secondary | ICD-10-CM | POA: Diagnosis present

## 2024-09-04 DIAGNOSIS — Z953 Presence of xenogenic heart valve: Secondary | ICD-10-CM | POA: Insufficient documentation

## 2024-09-04 DIAGNOSIS — I2510 Atherosclerotic heart disease of native coronary artery without angina pectoris: Secondary | ICD-10-CM | POA: Diagnosis not present

## 2024-09-04 DIAGNOSIS — R001 Bradycardia, unspecified: Secondary | ICD-10-CM | POA: Insufficient documentation

## 2024-09-04 DIAGNOSIS — Z951 Presence of aortocoronary bypass graft: Secondary | ICD-10-CM | POA: Diagnosis present

## 2024-09-04 NOTE — Patient Instructions (Signed)
 Medication Instructions:  Your physician recommends the following medication changes.  CHANGE: Amlodipine  2.5 mg in AM, 5 mg in PM  *If you need a refill on your cardiac medications before your next appointment, please call your pharmacy*  Lab Work: Your provider would like for you to have following labs drawn today BMP.   If you have labs (blood work) drawn today and your tests are completely normal, you will receive your results only by: MyChart Message (if you have MyChart) OR A paper copy in the mail If you have any lab test that is abnormal or we need to change your treatment, we will call you to review the results.  Testing/Procedures: No test ordered today   Follow-Up: At [facility], you and your health needs are our priority.  As part of our continuing mission to provide you with exceptional heart care, our providers are all part of one team.  This team includes your primary Cardiologist (physician) and Advanced Practice Providers or APPs (Physician Assistants and Nurse Practitioners) who all work together to provide you with the care you need, when you need it.  Your next appointment:   5 week(s)  Provider:   You may see Timothy Gollan, MD or one of the following Advanced Practice Providers on your designated Care Team:   Lonni Meager, NP Lesley Maffucci, PA-C Bernardino Bring, PA-C Cadence [place], PA-C Tylene Lunch, NP Barnie Hila, NP

## 2024-09-05 LAB — BASIC METABOLIC PANEL WITH GFR
BUN/Creatinine Ratio: 18 (ref 10–24)
BUN: 23 mg/dL (ref 8–27)
CO2: 22 mmol/L (ref 20–29)
Calcium: 9.3 mg/dL (ref 8.6–10.2)
Chloride: 102 mmol/L (ref 96–106)
Creatinine, Ser: 1.27 mg/dL (ref 0.76–1.27)
Glucose: 107 mg/dL — ABNORMAL HIGH (ref 70–99)
Potassium: 4.5 mmol/L (ref 3.5–5.2)
Sodium: 139 mmol/L (ref 134–144)
eGFR: 58 mL/min/1.73 — ABNORMAL LOW (ref >59)

## 2024-09-07 ENCOUNTER — Ambulatory Visit: Payer: Self-pay | Admitting: Cardiology

## 2024-09-07 NOTE — Progress Notes (Signed)
 Labs are stable. Continue current medication regimen with changes discussed at appointment.

## 2024-10-19 ENCOUNTER — Encounter: Payer: Self-pay | Admitting: Cardiology

## 2024-10-19 ENCOUNTER — Ambulatory Visit: Attending: Cardiology | Admitting: Cardiology

## 2024-10-19 VITALS — BP 130/74 | HR 64 | Ht 74.0 in | Wt 264.8 lb

## 2024-10-19 DIAGNOSIS — E782 Mixed hyperlipidemia: Secondary | ICD-10-CM | POA: Insufficient documentation

## 2024-10-19 DIAGNOSIS — Z951 Presence of aortocoronary bypass graft: Secondary | ICD-10-CM | POA: Insufficient documentation

## 2024-10-19 DIAGNOSIS — I10 Essential (primary) hypertension: Secondary | ICD-10-CM | POA: Insufficient documentation

## 2024-10-19 DIAGNOSIS — R600 Localized edema: Secondary | ICD-10-CM | POA: Diagnosis present

## 2024-10-19 DIAGNOSIS — Z953 Presence of xenogenic heart valve: Secondary | ICD-10-CM | POA: Insufficient documentation

## 2024-10-19 DIAGNOSIS — R001 Bradycardia, unspecified: Secondary | ICD-10-CM | POA: Insufficient documentation

## 2024-10-19 DIAGNOSIS — I2510 Atherosclerotic heart disease of native coronary artery without angina pectoris: Secondary | ICD-10-CM | POA: Insufficient documentation

## 2024-10-19 MED ORDER — AMLODIPINE BESYLATE 2.5 MG PO TABS: ORAL_TABLET | ORAL | 3 refills | Status: AC

## 2024-10-19 MED ORDER — VALSARTAN 160 MG PO TABS: 240.0000 mg | ORAL_TABLET | Freq: Every day | ORAL | 3 refills | Status: AC

## 2024-10-19 NOTE — Progress Notes (Signed)
 Cardiology Office Note   Date:  10/19/2024  ID:  Adam FORBES Leobardo Mickey., DOB June 04, 1946, MRN [id number] PCP: Fernande Ophelia JINNY DOUGLAS, MD  [facility] Providers Cardiologist:  Evalene Lunger, MD Cardiology APP:  Gerard Frederick, NP     History of Present Illness Adam Wurm. is a 78 y.o. male with a past medical history of coronary artery disease status post PCI/DES (03/2013), aortic valve stenosis status post AVR with porcine valve (01/2013), paroxysmal atrial fibrillation, status post CABG x 3 (LIMA-LAD, SVG-OM1, SVG-PDA, 06/13/2021), aortic valve replacement for valve stenosis of bioprosthetic valve (2015), hypertension, OSA on CPAP, hyperlipidemia, former smoker, who presents today for follow-up.   Extensive prior cardiac history with stent placement in 2014 as well as aortic valve replacement with porcine prosthesis in 2014.  He underwent additional aortic valve replacement for valve stenosis with bioprosthetic valve in 2015.  Cardiac catheterization in July 2022 showed progressive multivessel coronary artery disease deemed not amendable to further PCI (severe left main bifurcation disease with 60-70% RCA).  He underwent CABG x 3 in 06/2021 with LIMA to the LAD, SVG to OM1, and SVG-PDA.  Postoperative atrial fibrillation was noted.  Previously was seen in clinic 04/22/2019 Tuesday was doing well for cardiac perspective.  Denied any chest pain shortness of breath concerning for angina.  Continue to spend half the year in [place] support part-time.  He is scheduled for an updated echocardiogram to evaluate his prosthetic valve.  There were no medication changes that were made.  He was seen in clinic 07/14/2024 stating he was doing fairly well for the cardiac perspective.  He was having occasional bouts of palpitations.  He had also noted an uptick in his blood pressure with associated headache.  He states he had been evaluated in the emergency department due to elevated blood pressure and decided that  his metoprolol  to tartrate should be increased to 25 mg twice daily 50 mg twice daily.  He continues to be bradycardic on his dosing of metoprolol .  Lopressor  ended up being reduced and valsartan  was increased.  He was encouraged to monitor his pressures 1 to 2 hours postmedication and keep a log At home.  He was last seen in clinic 09/21/2024 stating overall he been doing fairly well.  He just returned from a fishing trip in [place] .  He also noticed blood pressures were better controlled 135-145/79-85.  With his blood pressures being elevated he was started on 2.5 mg of amlodipine  as his blood pressure was 150/82.  He was to continue his current medication regimen with changes monitor his pressure 1 to 2 hours postmedication administration and was sent for updated labs.   He returns to clinic today stating from the cardiac perspective he has been doing well.  Blood pressure has been well-controlled with the addition of 2.5 mg of amlodipine .  He has started to note some occasional swelling to his extremities.  He swells throughout the day and then goes to bed and wakes up in the morning and the swelling is gone.  States that he has been compliant with his current medication regimen.  Denies any hospitalizations or visits to the emergency department.  ROS: 10 point review of system has been reviewed and considered negative except was been listed in the HPI  Studies Reviewed EKG Interpretation Date/Time:  Monday October 19 2024 14:29:38 EST Ventricular Rate:  64 PR Interval:  190 QRS Duration:  108 QT Interval:  440 QTC Calculation: 453 R Axis:   -  19  Text Interpretation: Normal sinus rhythm Minimal voltage criteria for LVH, may be normal variant ( R in aVL ) When compared with ECG of 04-Sep-2024 08:00, No significant change since last tracing Confirmed by Gerard Frederick ([id number]) on 10/19/2024 2:38:36 PM    2D echo 07/13/2024 1. Left ventricular ejection fraction, by estimation, is 60 to 65%. The   left ventricle has normal function. The left ventricle has no regional  wall motion abnormalities. There is mild left ventricular hypertrophy.  Left ventricular diastolic parameters  were normal.   2. Right ventricular systolic function is normal. The right ventricular  size is normal.   3. The mitral valve is normal in structure. No evidence of mitral valve  regurgitation.   4. The aortic valve has been repaired/replaced. Aortic valve  regurgitation is not visualized. There is a bioprosthetic valve present in  the aortic position. Aortic valve mean gradient measures 9.0 mmHg.   5. Aortic dilatation noted. There is mild dilatation of the ascending  aorta, measuring 44 mm.    Risk Assessment/Calculations           Physical Exam VS:  BP 130/74   Pulse 64   Ht 6' 2 (1.88 m)   Wt 264 lb 12.8 oz (120.1 kg)   SpO2 98%   BMI 34.00 kg/m        Wt Readings from Last 3 Encounters:  10/19/24 264 lb 12.8 oz (120.1 kg)  09/04/24 260 lb 6.4 oz (118.1 kg)  07/14/24 258 lb 3.2 oz (117.1 kg)    GEN: Well nourished, well developed in no acute distress NECK: No JVD; No carotid bruits CARDIAC: RRR, I/VI systolic murmur, without rubs, gallops RESPIRATORY:  Clear to auscultation without rales, wheezing or rhonchi  ABDOMEN: Soft, non-tender, non-distended EXTREMITIES: Trace pretibial edema; No deformity   ASSESSMENT AND PLAN Coronary artery disease native coronary arteries with stable angina status post CABG x 3 vessel in 09/2021.  LIMA to LAD and vein graft x 2.  Currently continues to deny any symptoms of angina or anginal equivalents.  EKG today reveals a sinus rhythm with rate 64 with no acute ischemic changes.  He is continued on aspirin 81 mg daily, atorvastatin  80 mg daily, and Brilinta 150 mg every 2 weeks.  No further ischemic evaluation is needed at this time.  Previous EKGs revealed sinus bradycardia that has resolved at this time.  Previously metoprolol  dosing had been decreased.  He  has remained chronotropically competent.  Will continue to monitor with surveillance EKGs.  Severe aortic stenosis status post bioprosthetic aortic valve replacements.  Last echocardiogram was completed 07/2024 with an LVEF of 60 to 65%, no RWMA, mild LVH, right ventricular systolic function and size were normal, bioprosthetic valve present in aortic position, aortic valve mean gradient measured 9 mmHg, aortic root dilatation was 44 mm.  Will continue with annual studies.  Mixed hyperlipidemia with an LDL of 27.  He has been continued on atorvastatin  and Praluent .  Primary hypertension with a blood pressure today 130/74.  Blood pressure has been well-controlled since changes to medication.  He has continued on amlodipine  2.5 mg in the a.m. and 5 mg in the p.m., metoprolol  to tartrate 25 mg in a.m. and 50 mg in the p.m. and 240 mg of valsartan .  He has been encouraged to monitor his pressure 1 to 2 hours postmedication administration as well.  Last BMP kidney function remained stable.  Peripheral edema noted to the bilateral lower extremities predominantly ankles.  Likely a side effect from amlodipine .  Discussed increasing the dose of the valsartan  discontinued addition amlodipine .  With blood pressures being stable and swelling and not bothersome he will continue with his current medication regimen.  If needed we can discontinue the amlodipine  and adjust valsartan  at a later date.  Recommend to continue to elevate, foot calf pumps, and compression stockings when able.        Dispo: Patient to return to clinic to see me/APP in 6 months or sooner if needed for further evaluation.  Signed, Adam Pavich, NP

## 2024-10-19 NOTE — Patient Instructions (Signed)
 Medication Instructions:  Your physician recommends that you continue on your current medications as directed. Please refer to the Current Medication list given to you today.   *If you need a refill on your cardiac medications before your next appointment, please call your pharmacy*  Lab Work: No labs ordered today  If you have labs (blood work) drawn today and your tests are completely normal, you will receive your results only by: MyChart Message (if you have MyChart) OR A paper copy in the mail If you have any lab test that is abnormal or we need to change your treatment, we will call you to review the results.  Testing/Procedures: No test ordered today   Follow-Up: At [facility], you and your health needs are our priority.  As part of our continuing mission to provide you with exceptional heart care, our providers are all part of one team.  This team includes your primary Cardiologist (physician) and Advanced Practice Providers or APPs (Physician Assistants and Nurse Practitioners) who all work together to provide you with the care you need, when you need it.  Your next appointment:   6 month(s)  Provider:   You may see Timothy Gollan, MD or one of the following Advanced Practice Providers on your designated Care Team:   Tylene Lunch, NP
